# Patient Record
Sex: Female | Born: 1935 | Race: White | Hispanic: No | Smoking: Former smoker
Health system: Southern US, Community
[De-identification: ages and names within clinical notes are randomized; demographics above are authoritative.]

## PROBLEM LIST (undated history)

## (undated) DIAGNOSIS — E7800 Pure hypercholesterolemia, unspecified: Secondary | ICD-10-CM

## (undated) DIAGNOSIS — F329 Major depressive disorder, single episode, unspecified: Secondary | ICD-10-CM

## (undated) DIAGNOSIS — F32A Depression, unspecified: Secondary | ICD-10-CM

## (undated) DIAGNOSIS — N390 Urinary tract infection, site not specified: Secondary | ICD-10-CM

## (undated) HISTORY — DX: Major depressive disorder, single episode, unspecified: F32.9

## (undated) HISTORY — DX: Depression, unspecified: F32.A

## (undated) HISTORY — DX: Pure hypercholesterolemia, unspecified: E78.00

## (undated) HISTORY — PX: APPENDECTOMY: SHX54

---

## 2016-08-02 ENCOUNTER — Other Ambulatory Visit (HOSPITAL_COMMUNITY): Payer: Self-pay | Admitting: Nephrology

## 2016-08-02 DIAGNOSIS — N183 Chronic kidney disease, stage 3 unspecified: Secondary | ICD-10-CM

## 2016-09-08 ENCOUNTER — Ambulatory Visit (HOSPITAL_COMMUNITY)
Admission: RE | Admit: 2016-09-08 | Discharge: 2016-09-08 | Disposition: A | Discharge status: Home / Self Care | Payer: Medicare Other | Source: Ambulatory Visit | Attending: Nephrology | Admitting: Nephrology

## 2016-09-08 ENCOUNTER — Other Ambulatory Visit (HOSPITAL_COMMUNITY): Payer: Self-pay | Admitting: Diagnostic Radiology

## 2016-09-08 DIAGNOSIS — N183 Chronic kidney disease, stage 3 unspecified: Secondary | ICD-10-CM

## 2017-04-12 ENCOUNTER — Encounter (INDEPENDENT_AMBULATORY_CARE_PROVIDER_SITE_OTHER): Payer: Self-pay

## 2017-04-12 ENCOUNTER — Encounter (INDEPENDENT_AMBULATORY_CARE_PROVIDER_SITE_OTHER): Payer: Self-pay | Admitting: Internal Medicine

## 2017-05-05 ENCOUNTER — Encounter (INDEPENDENT_AMBULATORY_CARE_PROVIDER_SITE_OTHER): Payer: Self-pay | Admitting: Internal Medicine

## 2017-05-05 ENCOUNTER — Ambulatory Visit (INDEPENDENT_AMBULATORY_CARE_PROVIDER_SITE_OTHER): Payer: Medicare Other | Admitting: Internal Medicine

## 2017-05-05 VITALS — BP 138/60 | HR 64 | Temp 98.0°F | Ht 65.0 in | Wt 127.2 lb

## 2017-05-05 DIAGNOSIS — R634 Abnormal weight loss: Secondary | ICD-10-CM | POA: Diagnosis not present

## 2017-05-05 DIAGNOSIS — Z86010 Personal history of colonic polyps: Secondary | ICD-10-CM | POA: Diagnosis not present

## 2017-05-05 DIAGNOSIS — R110 Nausea: Secondary | ICD-10-CM | POA: Diagnosis not present

## 2017-05-05 LAB — HEPATIC FUNCTION PANEL
AG Ratio: 1.8 (calc) (ref 1.0–2.5)
ALT: 17 U/L (ref 6–29)
AST: 25 U/L (ref 10–35)
Albumin: 4.2 g/dL (ref 3.6–5.1)
Alkaline phosphatase (APISO): 61 U/L (ref 33–130)
Bilirubin, Direct: 0.1 mg/dL (ref 0.0–0.2)
Globulin: 2.3 g/dL (ref 1.9–3.7)
Indirect Bilirubin: 0.2 mg/dL (ref 0.2–1.2)
Total Bilirubin: 0.3 mg/dL (ref 0.2–1.2)
Total Protein: 6.5 g/dL (ref 6.1–8.1)

## 2017-05-05 LAB — CBC WITH DIFFERENTIAL/PLATELET
BASOS PCT: 0.3 %
Basophils Absolute: 19 cells/uL (ref 0–200)
EOS PCT: 2.1 %
Eosinophils Absolute: 130 cells/uL (ref 15–500)
HEMATOCRIT: 36.8 % (ref 35.0–45.0)
Hemoglobin: 12.6 g/dL (ref 11.7–15.5)
LYMPHS ABS: 1587 {cells}/uL (ref 850–3900)
MCH: 29.7 pg (ref 27.0–33.0)
MCHC: 34.2 g/dL (ref 32.0–36.0)
MCV: 86.8 fL (ref 80.0–100.0)
MPV: 11 fL (ref 7.5–12.5)
Monocytes Relative: 8.3 %
NEUTROS PCT: 63.7 %
Neutro Abs: 3949 cells/uL (ref 1500–7800)
PLATELETS: 230 10*3/uL (ref 140–400)
RBC: 4.24 10*6/uL (ref 3.80–5.10)
RDW: 12.4 % (ref 11.0–15.0)
Total Lymphocyte: 25.6 %
WBC: 6.2 10*3/uL (ref 3.8–10.8)
WBCMIX: 515 {cells}/uL (ref 200–950)

## 2017-05-05 MED ORDER — ONDANSETRON HCL 4 MG PO TABS: 4.0000 mg | ORAL_TABLET | Freq: Three times a day (TID) | ORAL | 1 refills | Status: DC | PRN

## 2017-05-05 NOTE — Progress Notes (Addendum)
   Subjective:    Patient ID: Kristen Best, female    DOB: December 18, 1932, 83 y.o.   MRN: [id number]  HPI Referred by Dr. Bryna ColanderKikel for unexpected weight loss. Hx of colonic polyps.  Hx of adenomatous polyp of colon. She denies any heart burn. She says she has nausea all at he time. Sometimes she can even look at food and becomes nauseated. She says she has no interest in anything and doesn't want to do anything.  She has to make her self do what she do. Her last colonoscopy was in 2014 by Dr. Aleene DavidsonSpainhour and was normal. Per records, no further screening colonoscopies.. I will try to find those records.  Last year, she weighed about 148. Today her weight is 127.2.  She says she is under a lot of stress.  She has a BM x 1 a day. No melena or BRRB.  Review of Systems No past medical history on file.  No past surgical history on file.  No Known Allergies  Current Outpatient Medications on File Prior to Visit  Medication Sig Dispense Refill  . citalopram (CELEXA) 20 MG tablet Take 20 mg daily by mouth.    . clonazePAM (KLONOPIN) 1 MG tablet Take 1 mg at bedtime by mouth.    . co-enzyme Q-10 30 MG capsule Take 30 mg 3 (three) times daily by mouth.    . fenofibrate 160 MG tablet Take 160 mg daily by mouth.    Marland Kitchen. lisinopril (PRINIVIL,ZESTRIL) 10 MG tablet Take 10 mg daily by mouth.     No current facility-administered medications on file prior to visit.         Objective:   Physical Exam Blood pressure 138/60, pulse 64, temperature 98 F (36.7 C), height 5\' 5"  (1.651 m), weight 127 lb 3.2 oz (57.7 kg). Alert and oriented. Skin warm and dry. Oral mucosa is moist.   . Sclera anicteric, conjunctivae is pink. Thyroid not enlarged. No cervical lymphadenopathy. Lungs clear. Heart regular rate and rhythm.  Abdomen is soft. Bowel sounds are positive. No hepatomegaly. No abdominal masses felt. No tenderness.  No edema to lower extremities.        Assessment & Plan:    Depression: Needs to follow  up with her PCP. Nausea related to her depression. Even the site of foods causes nausea. Needs to follow up with PCP concerning this [facility]

## 2017-05-05 NOTE — Patient Instructions (Signed)
Will get records from Dr. Aleene DavidsonSpainhour.

## 2017-05-26 ENCOUNTER — Encounter (INDEPENDENT_AMBULATORY_CARE_PROVIDER_SITE_OTHER): Payer: Self-pay | Admitting: Internal Medicine

## 2017-05-26 NOTE — Progress Notes (Signed)
Patient was given an appointment for 07/26/17 at 10:45am.  A letter was mailed to the patient.

## 2017-06-01 IMAGING — US US RENAL
1 series · 14 of 25 positions shown · non-contrast
Comparison: None.

CLINICAL DATA: Chronic kidney disease stage 3

EXAM:
RENAL / URINARY TRACT ULTRASOUND COMPLETE

[Series 1: us renal · 0.23mm/px · 14 of 54 slices shown]
[im 1/54]
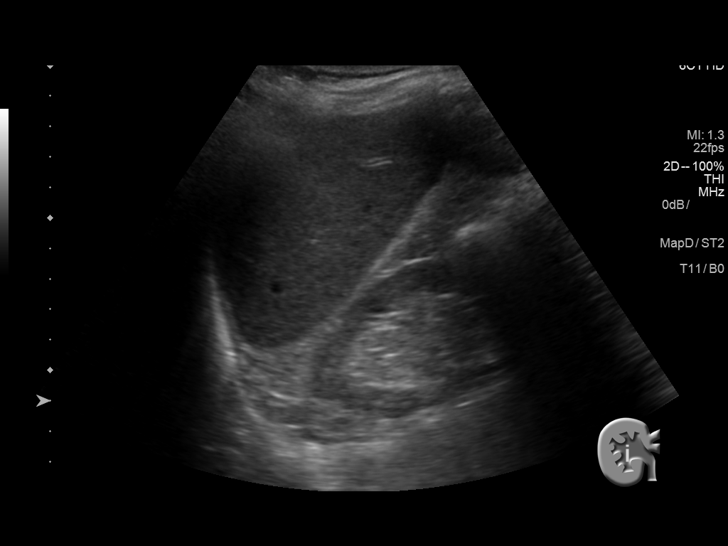
[im 5/54]
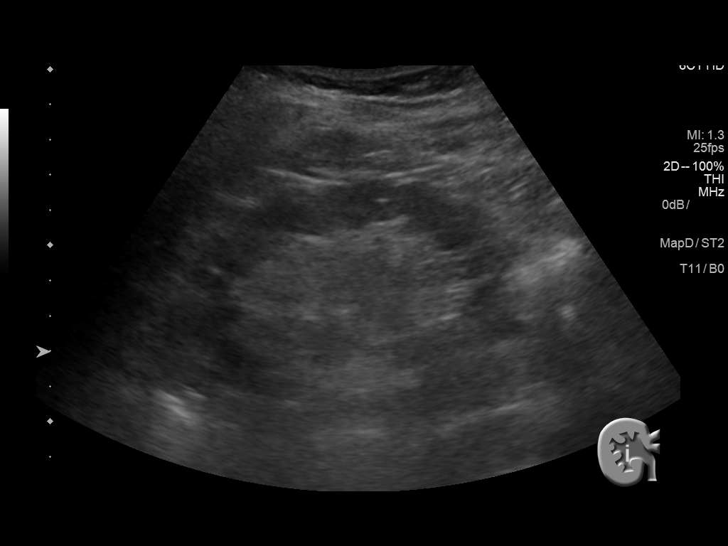
[im 9/54]
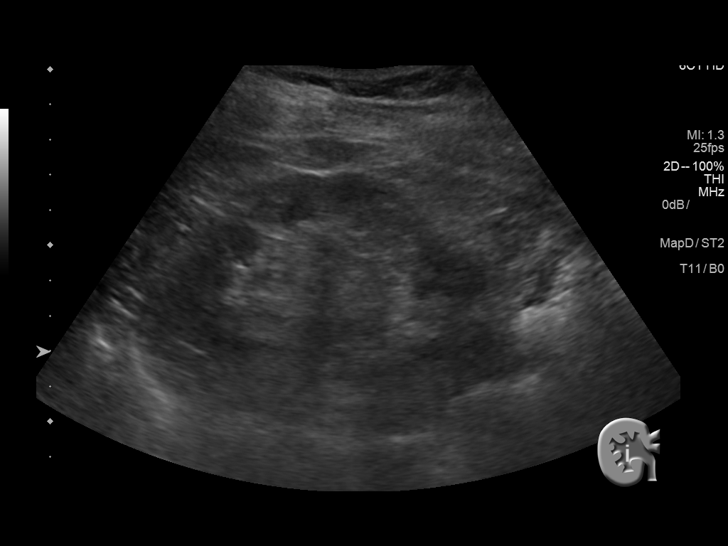
[im 14/54]
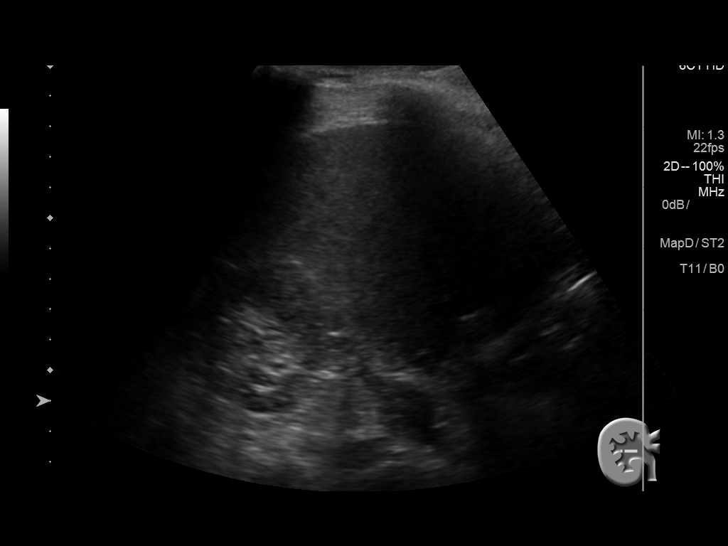
[im 18/54]
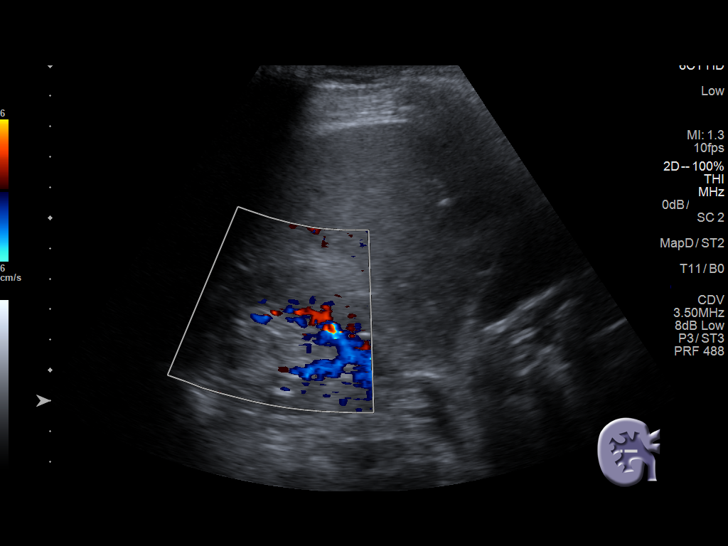
[im 20/54]
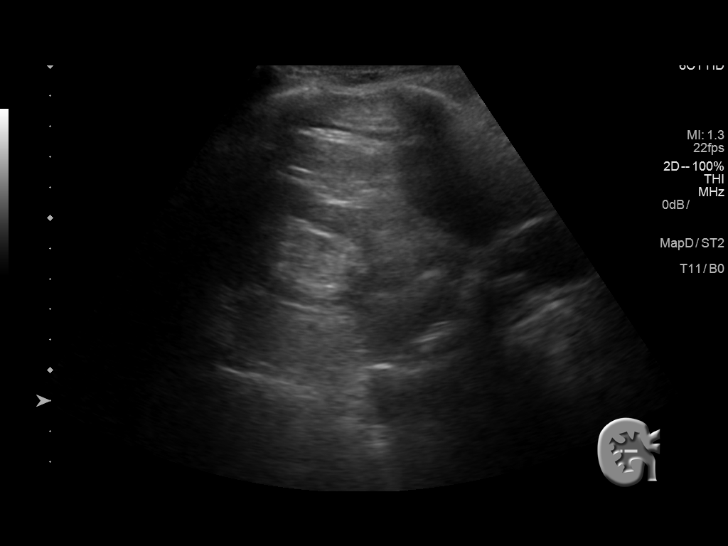
[im 25/54]
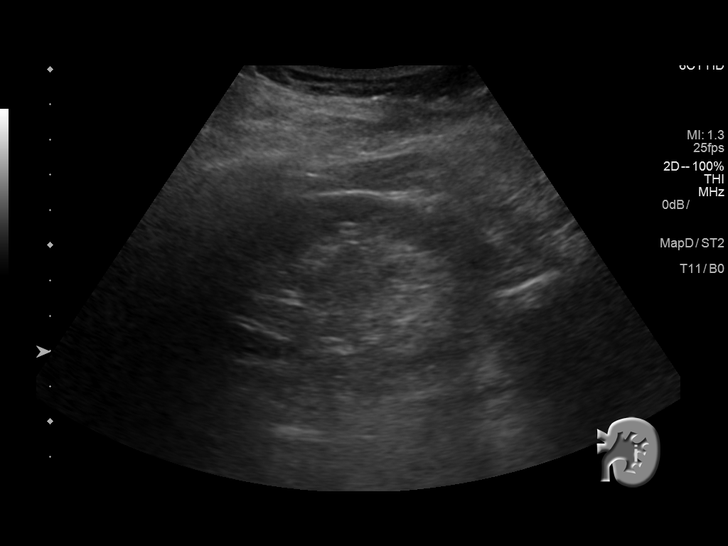
[im 29/54]
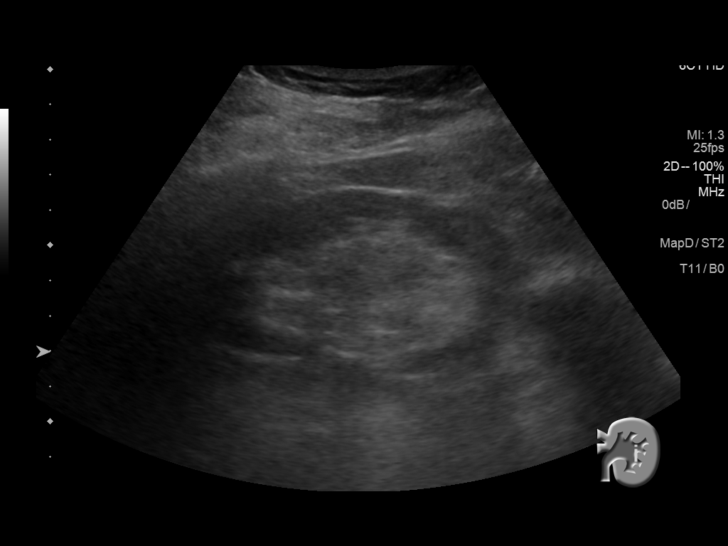
[im 34/54]
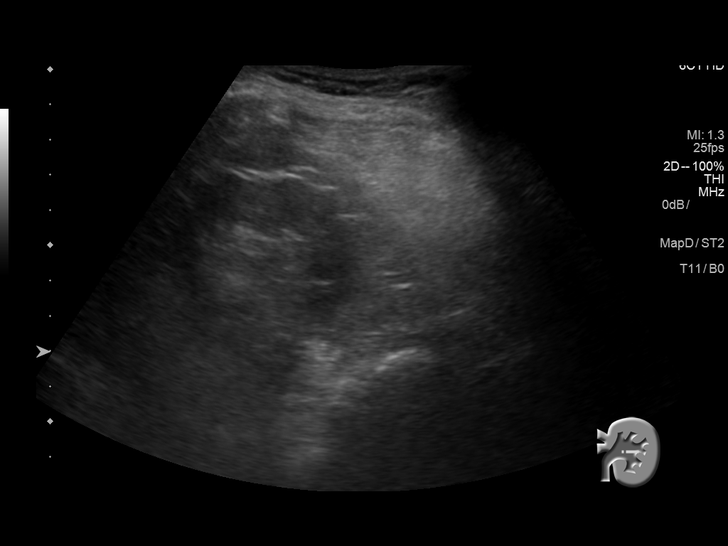
[im 36/54]
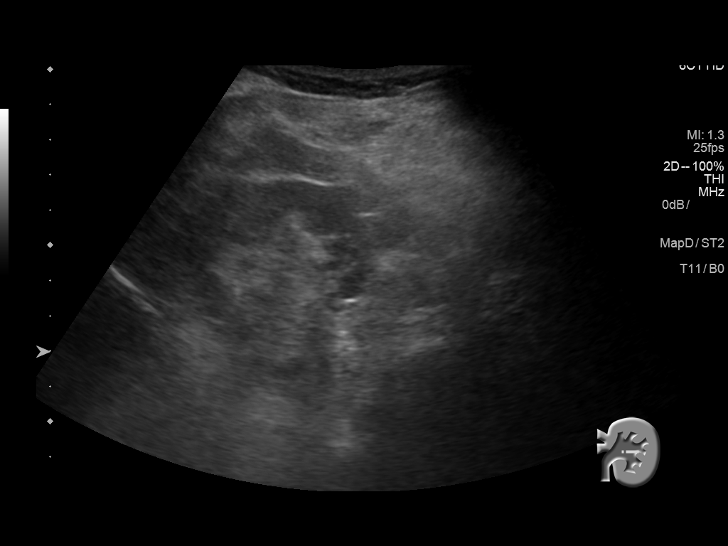
[im 40/54]
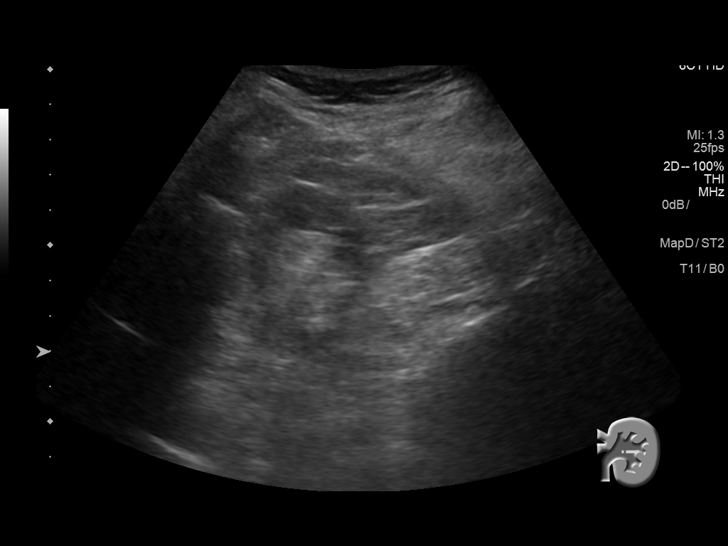
[im 45/54]
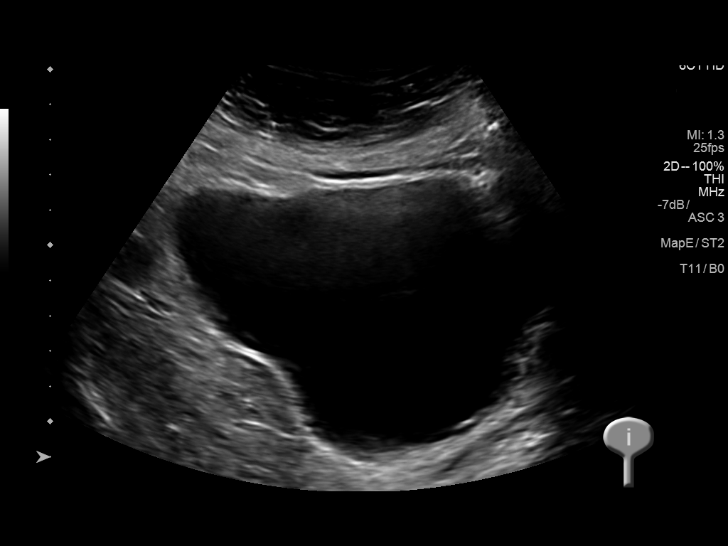
[im 49/54]
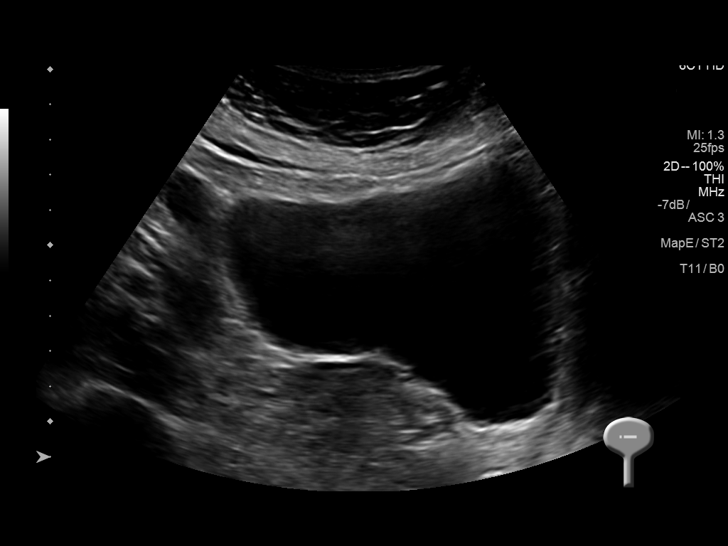
[im 54/54]
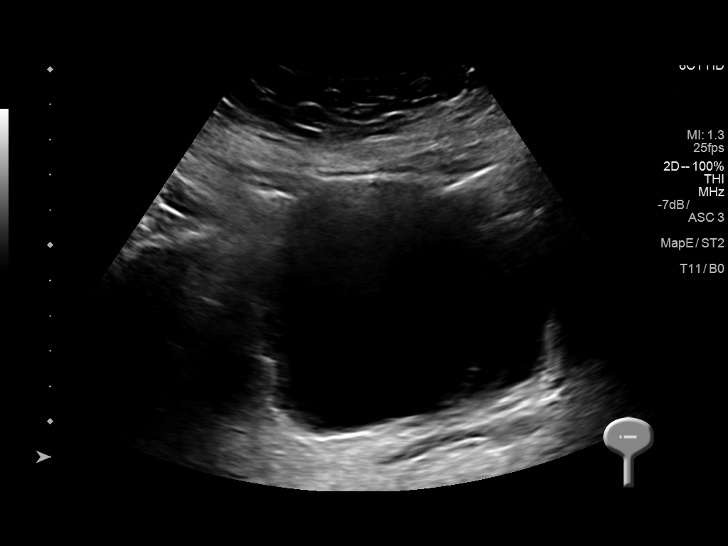

[14 of 25 positions shown; findings below may reference images not displayed]

FINDINGS: Right Kidney:

Length: 10.1 cm. Mild cortical thinning. Normal echotexture. No
hydronephrosis or mass.

Left Kidney:

Length: 10.6 cm. Mild cortical thinning. Normal echotexture. No
hydronephrosis or mass.

Bladder:

Appears normal for degree of bladder distention.
IMPRESSION: Cortical thinning bilaterally.  No acute findings.

## 2017-07-26 ENCOUNTER — Ambulatory Visit (INDEPENDENT_AMBULATORY_CARE_PROVIDER_SITE_OTHER): Payer: Medicare Other | Admitting: Internal Medicine

## 2021-12-26 ENCOUNTER — Emergency Department (HOSPITAL_COMMUNITY)
Admission: EM | Admit: 2021-12-26 | Discharge: 2021-12-26 | Disposition: A | Discharge status: Home / Self Care | Payer: Medicare Other | Attending: Emergency Medicine | Admitting: Emergency Medicine

## 2021-12-26 ENCOUNTER — Other Ambulatory Visit: Payer: Self-pay

## 2021-12-26 ENCOUNTER — Encounter (HOSPITAL_COMMUNITY): Payer: Self-pay

## 2021-12-26 DIAGNOSIS — F410 Panic disorder [episodic paroxysmal anxiety] without agoraphobia: Secondary | ICD-10-CM | POA: Diagnosis not present

## 2021-12-26 DIAGNOSIS — Z79899 Other long term (current) drug therapy: Secondary | ICD-10-CM | POA: Insufficient documentation

## 2021-12-26 DIAGNOSIS — R531 Weakness: Secondary | ICD-10-CM | POA: Insufficient documentation

## 2021-12-26 DIAGNOSIS — F419 Anxiety disorder, unspecified: Secondary | ICD-10-CM | POA: Insufficient documentation

## 2021-12-26 HISTORY — DX: Urinary tract infection, site not specified: N39.0

## 2021-12-26 LAB — COMPREHENSIVE METABOLIC PANEL
ALT: 19 U/L (ref 0–44)
AST: 32 U/L (ref 15–41)
Albumin: 3.8 g/dL (ref 3.5–5.0)
Alkaline Phosphatase: 116 U/L (ref 38–126)
Anion gap: 9 (ref 5–15)
BUN: 16 mg/dL (ref 8–23)
CO2: 21 mmol/L — ABNORMAL LOW (ref 22–32)
Calcium: 9.5 mg/dL (ref 8.9–10.3)
Chloride: 110 mmol/L (ref 98–111)
Creatinine, Ser: 1.22 mg/dL — ABNORMAL HIGH (ref 0.44–1.00)
GFR, Estimated: 43 mL/min — ABNORMAL LOW (ref >60)
Glucose, Bld: 92 mg/dL (ref 70–99)
Potassium: 3.6 mmol/L (ref 3.5–5.1)
Sodium: 140 mmol/L (ref 135–145)
Total Bilirubin: 0.9 mg/dL (ref 0.3–1.2)
Total Protein: 6.8 g/dL (ref 6.5–8.1)

## 2021-12-26 LAB — CBC WITH DIFFERENTIAL/PLATELET
Abs Immature Granulocytes: 0.01 10*3/uL (ref 0.00–0.07)
Basophils Absolute: 0 10*3/uL (ref 0.0–0.1)
Basophils Relative: 0 %
Eosinophils Absolute: 0.1 10*3/uL (ref 0.0–0.5)
Eosinophils Relative: 1 %
HCT: 38.9 % (ref 36.0–46.0)
Hemoglobin: 12.8 g/dL (ref 12.0–15.0)
Immature Granulocytes: 0 %
Lymphocytes Relative: 14 %
Lymphs Abs: 1 10*3/uL (ref 0.7–4.0)
MCH: 29.6 pg (ref 26.0–34.0)
MCHC: 32.9 g/dL (ref 30.0–36.0)
MCV: 90 fL (ref 80.0–100.0)
Monocytes Absolute: 0.7 10*3/uL (ref 0.1–1.0)
Monocytes Relative: 9 %
Neutro Abs: 5.7 10*3/uL (ref 1.7–7.7)
Neutrophils Relative %: 76 %
Platelets: 182 10*3/uL (ref 150–400)
RBC: 4.32 MIL/uL (ref 3.87–5.11)
RDW: 13.2 % (ref 11.5–15.5)
WBC: 7.5 10*3/uL (ref 4.0–10.5)
nRBC: 0 % (ref 0.0–0.2)

## 2021-12-26 LAB — URINALYSIS, ROUTINE W REFLEX MICROSCOPIC
Bacteria, UA: NONE SEEN
Bilirubin Urine: NEGATIVE
Glucose, UA: NEGATIVE mg/dL
Ketones, ur: 5 mg/dL — AB
Leukocytes,Ua: NEGATIVE
Nitrite: NEGATIVE
Protein, ur: NEGATIVE mg/dL
Specific Gravity, Urine: 1.003 — ABNORMAL LOW (ref 1.005–1.030)
pH: 6 (ref 5.0–8.0)

## 2021-12-26 MED ORDER — SODIUM CHLORIDE 0.9 % IV BOLUS
1000.0000 mL | Freq: Once | INTRAVENOUS | Status: AC
  Administered 2021-12-26: 1000 mL via INTRAVENOUS

## 2021-12-26 NOTE — ED Provider Notes (Signed)
[facility] EMERGENCY DEPARTMENT Provider Note   CSN: [id number] Arrival date & time: 12/26/21  1037     History  Chief Complaint  Patient presents with   Panic Attack    Kristen Best is a 88 y.o. female.  HPI Patient presents from EMS with generalized concerns of not feeling well.  She cannot specify when she began feeling well to state that she felt poorly and did not take her medication today, possibly also yesterday.  She has a chart review history notable for anxiety, but denies this, states that she takes no medication for it.  She does note that she has had falls recently, including one earlier in the week which resulted in obvious hematoma to her left face.  She denies any facial pain, however. EMS reports no hemodynamic instability in route.  Patient was awake, alert throughout transport.    Home Medications Prior to Admission medications   Medication Sig Start Date End Date Taking? Authorizing Provider  citalopram (CELEXA) 20 MG tablet Take 20 mg daily by mouth.    [provider]  clonazePAM (KLONOPIN) 1 MG tablet Take 1 mg at bedtime by mouth.    [provider]  co-enzyme Q-10 30 MG capsule Take 30 mg 3 (three) times daily by mouth.    [provider]  fenofibrate 160 MG tablet Take 160 mg daily by mouth.    [provider]  lisinopril (PRINIVIL,ZESTRIL) 10 MG tablet Take 10 mg daily by mouth.    [provider]  ondansetron (ZOFRAN) 4 MG tablet Take 1 tablet (4 mg total) every 8 (eight) hours as needed by mouth for nausea or vomiting. 05/05/17   Setzer, Brand Males, NP      Allergies    Patient has no known allergies.    Review of Systems   Review of Systems  All other systems reviewed and are negative.   Physical Exam Updated Vital Signs BP (!) 149/83   Pulse 81   Temp (!) 97.4 F (36.3 C) (Oral)   Resp 18   Ht 5\' 7"  (1.702 m)   Wt 63 kg   SpO2 100%   BMI 21.77 kg/m  Physical Exam Vitals and nursing note  reviewed.  Constitutional:      General: She is not in acute distress.    Appearance: She is well-developed.  HENT:     Head: Normocephalic.   Eyes:     Conjunctiva/sclera: Conjunctivae normal.  Cardiovascular:     Rate and Rhythm: Normal rate and regular rhythm.  Pulmonary:     Effort: Pulmonary effort is normal. No respiratory distress.     Breath sounds: Normal breath sounds. No stridor.  Abdominal:     General: There is no distension.  Skin:    General: Skin is warm and dry.  Neurological:     Mental Status: She is alert and oriented to person, place, and time.     Cranial Nerves: No cranial nerve deficit or dysarthria.     Motor: Atrophy present. No tremor or abnormal muscle tone.  Psychiatric:        Mood and Affect: Mood normal.     Comments: Not insightful into her history, nor today's presentation.     ED Results / Procedures / Treatments   Labs (all labs ordered are listed, but only abnormal results are displayed) Labs Reviewed  COMPREHENSIVE METABOLIC PANEL - Abnormal; Notable for the following components:      Result Value   CO2 21 (*)  Creatinine, Ser 1.22 (*)    GFR, Estimated 43 (*)    All other components within normal limits  URINALYSIS, ROUTINE W REFLEX MICROSCOPIC - Abnormal; Notable for the following components:   Color, Urine COLORLESS (*)    Specific Gravity, Urine 1.003 (*)    Hgb urine dipstick SMALL (*)    Ketones, ur 5 (*)    All other components within normal limits  CBC WITH DIFFERENTIAL/PLATELET    EKG EKG Interpretation  Date/Time:  Saturday December 26 2021 10:44:27 EDT Ventricular Rate:  88 PR Interval:  163 QRS Duration: 90 QT Interval:  390 QTC Calculation: 472 R Axis:   70 Text Interpretation: Sinus rhythm Low voltage, precordial leads Artifact Abnormal ECG Confirmed by Gerhard Munch [phone]) on 12/26/2021 10:51:13 AM  Radiology No results found.  Procedures Procedures    Medications Ordered in ED Medications  sodium  chloride 0.9 % bolus 1,000 mL (0 mLs Intravenous Stopped 12/26/21 1406)    ED Course/ Medical Decision Making/ A&P This patient with a Hx of multiple medical issues including anxiety, hypertension presents to the ED for concern of generalized weakness, this involves an extensive number of treatment options, and is a complaint that carries with it a high risk of complications and morbidity.    The differential diagnosis includes electrolyte abnormalities, medication noncompliance, dehydration, bacteremia, sepsis   Social Determinants of Health:  PMH, anxiety  Additional history obtained:  Additional history and/or information obtained from EMS, notable for details of HPI   After the initial evaluation, orders, including: Labs ECG were initiated.   Patient placed on Cardiac and Pulse-Oximetry Monitors. The patient was maintained on a cardiac monitor.  The cardiac monitored showed an rhythm of 80 sinus normal The patient was also maintained on pulse oximetry. The readings were typically 100% room air normal   On repeat evaluation of the patient improved 2:46 PM Patient states I feel pretty good.  She is accompanied by her granddaughter and son for reviewing all Lab Tests:  I personally interpreted labs.  The pertinent results include: No evidence for urinary tract infection, mild ketone urea, creatinine 1.22 both consistent with mild dehydration   Dispostion / Final MDM:  After consideration of the diagnostic results and the patient's response to treatment, this adult female presenting from home with varying complaints, is awake, alert, sitting upright, monitored for hours without decompensation, arrhythmia, hypotension, fever, with reassuring labs low suspicion for uremia, bacteremia or sepsis.  Patient moves all extremity spontaneously, has no facial asymmetry, and that she has fallen in the past week no suspicion for intracranial hemorrhage or fracture.  With son present we  discussed her history, today's fall, though admission was a consideration given her advanced age, unclear circumstances of the presentation, with reassuring findings, he and they are okay with outpatient follow-up, and he will facilitate gerontology follow-up, assessment.  Final Clinical Impression(s) / ED Diagnoses Final diagnoses:  Weakness     Gerhard Munch, MD 12/26/21 1446

## 2021-12-26 NOTE — ED Triage Notes (Signed)
Pt lives at home alone  and c/o heavy breathing and numbness in her hands and arms. Pt has a hx of anxiety but is not being treated. Pt fell on weds without being seen, bruise on left side of face. Pt is on Amoxicillin for UTI , taken 5 doses.

## 2021-12-26 NOTE — Discharge Instructions (Addendum)
As discussed, today's evaluation has been generally reassuring.  However, with today's evaluation for weakness, some increased frequency of falling, and your recent urinary tract infection and is very importantly follow-up with an outpatient clinician for appropriate ongoing outpatient evaluation including consideration of your medication regimen.  Return here for concerning changes in your condition.

## 2024-01-07 ENCOUNTER — Inpatient Hospital Stay (HOSPITAL_COMMUNITY)
Admission: EM | Admit: 2024-01-07 | Discharge: 2024-01-12 | DRG: 563 | Disposition: A | Discharge status: Skilled Nursing Facility | Attending: Family Medicine | Admitting: Family Medicine

## 2024-01-07 ENCOUNTER — Encounter (HOSPITAL_COMMUNITY): Payer: Self-pay

## 2024-01-07 ENCOUNTER — Inpatient Hospital Stay (HOSPITAL_COMMUNITY)

## 2024-01-07 ENCOUNTER — Other Ambulatory Visit: Payer: Self-pay

## 2024-01-07 ENCOUNTER — Emergency Department (HOSPITAL_COMMUNITY)

## 2024-01-07 DIAGNOSIS — F32A Depression, unspecified: Secondary | ICD-10-CM | POA: Diagnosis present

## 2024-01-07 DIAGNOSIS — Z79899 Other long term (current) drug therapy: Secondary | ICD-10-CM

## 2024-01-07 DIAGNOSIS — S52611A Displaced fracture of right ulna styloid process, initial encounter for closed fracture: Secondary | ICD-10-CM | POA: Diagnosis present

## 2024-01-07 DIAGNOSIS — F0394 Unspecified dementia, unspecified severity, with anxiety: Secondary | ICD-10-CM | POA: Diagnosis present

## 2024-01-07 DIAGNOSIS — I129 Hypertensive chronic kidney disease with stage 1 through stage 4 chronic kidney disease, or unspecified chronic kidney disease: Secondary | ICD-10-CM | POA: Diagnosis present

## 2024-01-07 DIAGNOSIS — M25532 Pain in left wrist: Secondary | ICD-10-CM | POA: Diagnosis present

## 2024-01-07 DIAGNOSIS — F0393 Unspecified dementia, unspecified severity, with mood disturbance: Secondary | ICD-10-CM | POA: Diagnosis present

## 2024-01-07 DIAGNOSIS — W010XXA Fall on same level from slipping, tripping and stumbling without subsequent striking against object, initial encounter: Secondary | ICD-10-CM | POA: Diagnosis present

## 2024-01-07 DIAGNOSIS — F0390 Unspecified dementia without behavioral disturbance: Secondary | ICD-10-CM | POA: Diagnosis present

## 2024-01-07 DIAGNOSIS — R413 Other amnesia: Secondary | ICD-10-CM | POA: Diagnosis present

## 2024-01-07 DIAGNOSIS — W19XXXA Unspecified fall, initial encounter: Secondary | ICD-10-CM | POA: Diagnosis present

## 2024-01-07 DIAGNOSIS — S62102A Fracture of unspecified carpal bone, left wrist, initial encounter for closed fracture: Secondary | ICD-10-CM | POA: Diagnosis not present

## 2024-01-07 DIAGNOSIS — I10 Essential (primary) hypertension: Secondary | ICD-10-CM | POA: Diagnosis not present

## 2024-01-07 DIAGNOSIS — N1832 Chronic kidney disease, stage 3b: Secondary | ICD-10-CM | POA: Diagnosis present

## 2024-01-07 DIAGNOSIS — E559 Vitamin D deficiency, unspecified: Secondary | ICD-10-CM | POA: Diagnosis present

## 2024-01-07 DIAGNOSIS — F03C0 Unspecified dementia, severe, without behavioral disturbance, psychotic disturbance, mood disturbance, and anxiety: Secondary | ICD-10-CM

## 2024-01-07 DIAGNOSIS — S52502A Unspecified fracture of the lower end of left radius, initial encounter for closed fracture: Secondary | ICD-10-CM | POA: Diagnosis present

## 2024-01-07 DIAGNOSIS — E1122 Type 2 diabetes mellitus with diabetic chronic kidney disease: Secondary | ICD-10-CM | POA: Diagnosis present

## 2024-01-07 DIAGNOSIS — Y92009 Unspecified place in unspecified non-institutional (private) residence as the place of occurrence of the external cause: Secondary | ICD-10-CM | POA: Diagnosis not present

## 2024-01-07 DIAGNOSIS — Z87891 Personal history of nicotine dependence: Secondary | ICD-10-CM | POA: Diagnosis not present

## 2024-01-07 DIAGNOSIS — E782 Mixed hyperlipidemia: Secondary | ICD-10-CM | POA: Diagnosis present

## 2024-01-07 DIAGNOSIS — E785 Hyperlipidemia, unspecified: Secondary | ICD-10-CM | POA: Insufficient documentation

## 2024-01-07 LAB — BASIC METABOLIC PANEL WITH GFR
Anion gap: 12 (ref 5–15)
BUN: 24 mg/dL — ABNORMAL HIGH (ref 8–23)
CO2: 21 mmol/L — ABNORMAL LOW (ref 22–32)
Calcium: 9.6 mg/dL (ref 8.9–10.3)
Chloride: 103 mmol/L (ref 98–111)
Creatinine, Ser: 1.33 mg/dL — ABNORMAL HIGH (ref 0.44–1.00)
GFR, Estimated: 38 mL/min — ABNORMAL LOW (ref >60)
Glucose, Bld: 223 mg/dL — ABNORMAL HIGH (ref 70–99)
Potassium: 4.2 mmol/L (ref 3.5–5.1)
Sodium: 136 mmol/L (ref 135–145)

## 2024-01-07 LAB — CBC WITH DIFFERENTIAL/PLATELET
Abs Immature Granulocytes: 0.04 K/uL (ref 0.00–0.07)
Basophils Absolute: 0 K/uL (ref 0.0–0.1)
Basophils Relative: 0 %
Eosinophils Absolute: 0 K/uL (ref 0.0–0.5)
Eosinophils Relative: 0 %
HCT: 37.5 % (ref 36.0–46.0)
Hemoglobin: 12.7 g/dL (ref 12.0–15.0)
Immature Granulocytes: 0 %
Lymphocytes Relative: 9 %
Lymphs Abs: 0.8 K/uL (ref 0.7–4.0)
MCH: 30.7 pg (ref 26.0–34.0)
MCHC: 33.9 g/dL (ref 30.0–36.0)
MCV: 90.6 fL (ref 80.0–100.0)
Monocytes Absolute: 0.7 K/uL (ref 0.1–1.0)
Monocytes Relative: 7 %
Neutro Abs: 7.5 K/uL (ref 1.7–7.7)
Neutrophils Relative %: 84 %
Platelets: 203 K/uL (ref 150–400)
RBC: 4.14 MIL/uL (ref 3.87–5.11)
RDW: 13.6 % (ref 11.5–15.5)
WBC: 9 K/uL (ref 4.0–10.5)
nRBC: 0 % (ref 0.0–0.2)

## 2024-01-07 LAB — CK: Total CK: 238 U/L — ABNORMAL HIGH (ref 38–234)

## 2024-01-07 LAB — GLUCOSE, CAPILLARY: Glucose-Capillary: 225 mg/dL — ABNORMAL HIGH (ref 70–99)

## 2024-01-07 MED ORDER — ENOXAPARIN SODIUM 30 MG/0.3ML IJ SOSY
30.0000 mg | PREFILLED_SYRINGE | INTRAMUSCULAR | Status: DC
  Administered 2024-01-07 – 2024-01-11 (×5): 30 mg via SUBCUTANEOUS
  Filled 2024-01-07 (×5): qty 0.3

## 2024-01-07 MED ORDER — CLONAZEPAM 0.5 MG PO TABS
1.0000 mg | ORAL_TABLET | Freq: Every day | ORAL | Status: DC
  Administered 2024-01-07 – 2024-01-11 (×5): 1 mg via ORAL
  Filled 2024-01-07 (×5): qty 2

## 2024-01-07 MED ORDER — ONDANSETRON HCL 4 MG/2ML IJ SOLN: 4.0000 mg | Freq: Four times a day (QID) | INTRAMUSCULAR | Status: DC | PRN

## 2024-01-07 MED ORDER — DIPHENHYDRAMINE HCL 50 MG/ML IJ SOLN
25.0000 mg | Freq: Once | INTRAMUSCULAR | Status: AC
  Administered 2024-01-07: 25 mg via INTRAVENOUS
  Filled 2024-01-07: qty 1

## 2024-01-07 MED ORDER — OXYCODONE HCL 5 MG PO TABS
5.0000 mg | ORAL_TABLET | Freq: Once | ORAL | Status: AC
  Administered 2024-01-07: 5 mg via ORAL
  Filled 2024-01-07: qty 1

## 2024-01-07 MED ORDER — DROPERIDOL 2.5 MG/ML IJ SOLN
1.2500 mg | Freq: Once | INTRAMUSCULAR | Status: AC
  Administered 2024-01-07: 1.25 mg via INTRAVENOUS
  Filled 2024-01-07: qty 2

## 2024-01-07 MED ORDER — OXYCODONE HCL 5 MG PO TABS: 5.0000 mg | ORAL_TABLET | Freq: Three times a day (TID) | ORAL | 0 refills | Status: DC | PRN

## 2024-01-07 MED ORDER — BISACODYL 5 MG PO TBEC: 5.0000 mg | DELAYED_RELEASE_TABLET | Freq: Every day | ORAL | Status: DC | PRN

## 2024-01-07 MED ORDER — ROSUVASTATIN CALCIUM 20 MG PO TABS
20.0000 mg | ORAL_TABLET | Freq: Every day | ORAL | Status: DC
  Administered 2024-01-08 – 2024-01-12 (×5): 20 mg via ORAL
  Filled 2024-01-07 (×5): qty 1

## 2024-01-07 MED ORDER — LIDOCAINE-EPINEPHRINE (PF) 2 %-1:200000 IJ SOLN
10.0000 mL | Freq: Once | INTRAMUSCULAR | Status: AC
  Administered 2024-01-07: 10 mL via INTRADERMAL
  Filled 2024-01-07: qty 20

## 2024-01-07 MED ORDER — ONDANSETRON HCL 4 MG PO TABS: 4.0000 mg | ORAL_TABLET | Freq: Four times a day (QID) | ORAL | Status: DC | PRN

## 2024-01-07 MED ORDER — OXYCODONE HCL 5 MG PO TABS
5.0000 mg | ORAL_TABLET | ORAL | Status: DC | PRN
  Administered 2024-01-07 – 2024-01-10 (×2): 5 mg via ORAL
  Filled 2024-01-07 (×5): qty 1

## 2024-01-07 MED ORDER — LISINOPRIL 10 MG PO TABS
10.0000 mg | ORAL_TABLET | Freq: Every day | ORAL | Status: DC
  Administered 2024-01-08 – 2024-01-10 (×3): 10 mg via ORAL
  Filled 2024-01-07 (×3): qty 1

## 2024-01-07 MED ORDER — DONEPEZIL HCL 5 MG PO TABS
5.0000 mg | ORAL_TABLET | Freq: Every day | ORAL | Status: DC
  Administered 2024-01-07 – 2024-01-12 (×6): 5 mg via ORAL
  Filled 2024-01-07 (×6): qty 1

## 2024-01-07 MED ORDER — MEMANTINE HCL 10 MG PO TABS
10.0000 mg | ORAL_TABLET | Freq: Two times a day (BID) | ORAL | Status: DC
  Administered 2024-01-07 – 2024-01-12 (×10): 10 mg via ORAL
  Filled 2024-01-07 (×10): qty 1

## 2024-01-07 MED ORDER — FENOFIBRATE 160 MG PO TABS: 160.0000 mg | ORAL_TABLET | Freq: Every day | ORAL | Status: DC

## 2024-01-07 MED ORDER — INSULIN ASPART 100 UNIT/ML IJ SOLN
0.0000 [IU] | Freq: Every day | INTRAMUSCULAR | Status: DC
  Administered 2024-01-07: 2 [IU] via SUBCUTANEOUS

## 2024-01-07 MED ORDER — INSULIN ASPART 100 UNIT/ML IJ SOLN
0.0000 [IU] | Freq: Three times a day (TID) | INTRAMUSCULAR | Status: DC
  Administered 2024-01-08 (×3): 2 [IU] via SUBCUTANEOUS
  Administered 2024-01-09: 3 [IU] via SUBCUTANEOUS

## 2024-01-07 MED ORDER — BUPIVACAINE-EPINEPHRINE (PF) 0.5% -1:200000 IJ SOLN: 10.0000 mL | Freq: Once | INTRAMUSCULAR | Status: DC

## 2024-01-07 MED ORDER — AMITRIPTYLINE HCL 25 MG PO TABS
50.0000 mg | ORAL_TABLET | Freq: Every day | ORAL | Status: DC
  Administered 2024-01-07 – 2024-01-11 (×5): 50 mg via ORAL
  Filled 2024-01-07 (×5): qty 2

## 2024-01-07 MED ORDER — SODIUM CHLORIDE 0.9 % IV BOLUS
500.0000 mL | Freq: Once | INTRAVENOUS | Status: AC
  Administered 2024-01-07: 500 mL via INTRAVENOUS

## 2024-01-07 NOTE — ED Notes (Signed)
Pt ambulated to the bathroom with stand by assistance.

## 2024-01-07 NOTE — ED Notes (Signed)
 Pt placed in finger traps per provider.

## 2024-01-07 NOTE — H&P (Signed)
 History and Physical    Patient: Kristen Best FMW:[id number] DOB: [birth year] DOA: 01/07/2024 DOS: the patient was seen and examined on 01/07/2024 PCP: Patient, No Pcp Per  Patient coming from: Home  Chief Complaint:  Chief Complaint  Patient presents with   Fall   HPI: Kristen Best is a 89 y.o. female with medical history significant of dementia, hypertension, hyperlipidemia, diabetes.  Patient lives with caregiver who was recently hospitalized and will have a prolonged hospitalization.  Patient was home last night and got out of bed.  She ended up falling onto her outstretched hand.  No head injury or loss of consciousness.  She continues to have pain in her left wrist and was brought to the emergency department for evaluation.  No numbness or tingling in the left hand.  She has nobody else to care for her at home.  Review of Systems: As mentioned in the history of present illness. All other systems reviewed and are negative. Past Medical History:  Diagnosis Date   Depression    High cholesterol    UTI (urinary tract infection)    Past Surgical History:  Procedure Laterality Date   APPENDECTOMY     Social History:  reports that she has quit smoking. She has never used smokeless tobacco. She reports that she does not drink alcohol and does not use drugs.  No Known Allergies  History reviewed. No pertinent family history.  Prior to Admission medications   Medication Sig Start Date End Date Taking? Authorizing Provider  oxyCODONE  (ROXICODONE ) 5 MG immediate release tablet Take 1 tablet (5 mg total) by mouth every 8 (eight) hours as needed for severe pain (pain score 7-10). 01/07/24  Yes Elnor Savant A, DO  amitriptyline  (ELAVIL ) 50 MG tablet Take 50 mg by mouth at bedtime.    [provider]  citalopram (CELEXA) 20 MG tablet Take 20 mg daily by mouth.    [provider]  clonazePAM  (KLONOPIN ) 1 MG tablet Take 1 mg at bedtime by mouth.    [provider]  co-enzyme Q-10 30 MG capsule Take 30 mg 3 (three) times daily by mouth.    [provider]  donepezil  (ARICEPT ) 10 MG tablet Take 5 mg by mouth daily.    [provider]  fenofibrate  160 MG tablet Take 160 mg daily by mouth.    [provider]  lisinopril  (PRINIVIL ,ZESTRIL ) 10 MG tablet Take 10 mg daily by mouth.    [provider]  memantine  (NAMENDA ) 10 MG tablet Take 10 mg by mouth 2 (two) times daily.    [provider]  ondansetron  (ZOFRAN ) 4 MG tablet Take 1 tablet (4 mg total) every 8 (eight) hours as needed by mouth for nausea or vomiting. 05/05/17   Setzer, Veva CROME, NP  rosuvastatin  (CRESTOR ) 20 MG tablet Take 20 mg by mouth daily.    [provider]  venlafaxine XR (EFFEXOR-XR) 150 MG 24 hr capsule Take 150 mg by mouth every morning.    [provider]    Physical Exam: Vitals:   01/07/24 1500 01/07/24 1530 01/07/24 1600 01/07/24 1759  BP: (!) 152/78 (!) 163/73 (!) 147/84 (!) 164/81  Pulse: 81 77  83  Resp: 18 18  17   Temp:  98.6 F (37 C)  98.5 F (36.9 C)  TempSrc:  Oral    SpO2: 96% 96%  97%   General: Elderly female. Awake and alert and oriented x1. No acute cardiopulmonary distress.  HEENT: Normocephalic atraumatic.  Right  and left ears normal in appearance.  Pupils equal, round, reactive to light. Extraocular muscles are intact. Sclerae anicteric and noninjected.  Moist mucosal membranes. No mucosal lesions.  Neck: Neck supple without lymphadenopathy. No carotid bruits. No masses palpated.  Cardiovascular: Regular rate with normal S1-S2 sounds. No murmurs, rubs, gallops auscultated. No JVD.  Respiratory: Good respiratory effort with no wheezes, rales, rhonchi. Lungs clear to auscultation bilaterally.  No accessory muscle use. Abdomen: Soft, nontender, nondistended. Active bowel sounds. No masses or hepatosplenomegaly  Skin: No rashes, lesions, or ulcerations.  Dry, warm to touch. 2+ dorsalis  pedis and radial pulses. Musculoskeletal: No calf or leg pain. All major joints not erythematous nontender.  No upper or lower joint deformation.  Good ROM.  No contractures  Psychiatric: Lacks insight. Neurologic:  Strength is 5/5 and symmetric in upper and lower extremities.  Cranial nerves II through XII are grossly intact.  Data Reviewed: Labs and images reviewed  Assessment and Plan: No notes have been filed under this hospital service. Service: Hospitalist  Principal Problem:   Dementia [facility]) Active Problems:   Wrist fracture, closed, left, initial encounter   Essential hypertension   Memory loss   Stage 3b chronic kidney disease (HCC)  Left wrist fracture Splinted EDP discussed with Ortho -no need for emergent surgery unless develops numbness or loss of pulse Pain control PT/OT Patient does not have caregiver at home and will need placement Dementia On Namenda  and Aricept 's Stage III chronic kidney disease Creatinine slightly elevated.  Will hydrate Hypertension Continue antihypertensives   Advance Care Planning:   Code Status: Full Code unable to contact family, presumed full code as patient unable to specify for herself due to dementia  Consults: None  Family Communication: None  Severity of Illness: The appropriate patient status for this patient is OBSERVATION. Observation status is judged to be reasonable and necessary in order to provide the required intensity of service to ensure the patient's safety. The patient's presenting symptoms, physical exam findings, and initial radiographic and laboratory data in the context of their medical condition is felt to place them at decreased risk for further clinical deterioration. Furthermore, it is anticipated that the patient will be medically stable for discharge from the hospital within 2 midnights of admission.   Author: Liandro Thelin J Sufyaan Palma, DO 01/07/2024 6:10 PM  For on call review [email or web address].

## 2024-01-07 NOTE — ED Provider Notes (Addendum)
 This patient is 89 years old and presents after having a fall where she broke her forearm, she was immobilized by the physician this morning, unfortunately she lives by herself and does not have home health at this time, I did discuss the case with Dr. Romona who does not think the patient needs any emergent surgery unless the patient develops neurologic symptoms or severe pain neither of which she has at this time on my exam.  Will discuss with local hospitalist for admission  Dr. Barbra is agreeable to admit the patient to the hospital   Cleotilde Rogue, MD 01/07/24 1705    Cleotilde Rogue, MD 01/07/24 1711

## 2024-01-07 NOTE — ED Notes (Addendum)
 This writer walked into pt room to pt having gotten out of bed, pulled the wires out of the monitor and attempting to pull out IV, this Clinical research associate, another tech, and an Charity fundraiser into room to attempt to redirect pt, pt confused, adamant that she is going to walk home and that her uncle and brothers are at home waiting for her, MD notified and in room. Pt moved into hallway for safety so staff can see pt at all times.

## 2024-01-07 NOTE — Discharge Instructions (Addendum)
 It was a pleasure caring for you today in the emergency department.  Please call Dr. Romona on Monday to arrange follow-up in the office  Please return to the emergency department for any worsening or worrisome symptoms.

## 2024-01-07 NOTE — ED Notes (Signed)
 Pt sitting in the hallway in a recliner next to nurses desk. Continues to take off her sling and trys to remove her splint.

## 2024-01-07 NOTE — ED Notes (Signed)
 Kristen Best: [phone]

## 2024-01-07 NOTE — ED Provider Notes (Signed)
 [place] EMERGENCY DEPARTMENT AT [facility] Provider Note  CSN: [id number] Arrival date & time: 01/07/24 1043  Chief Complaint(s) Fall  HPI Kristen Best is a 89 y.o. female with past medical history as below, significant for depression, high cholesterol, appendectomy who presents to the ED with complaint of fall, wrist injury  Patient reports she got out of bed last night around 10 or 11 PM, her feet became entangled because it was dark in the room and she was wearing poor fitting slippers and she fell to the ground.  Fell onto her outstretched left arm.  Denies head injury or LOC. She was unable to get up off the floor.  Woke up this morning with significant pain to her left forearm and swelling. Not taking blood thinners, denies head injury, denies prior orthopedic intervention.  No numbness or tingling to her fingertips but does have reduced range of motion to her fingers and left hand secondary to pain, sig swelling  Past Medical History Past Medical History:  Diagnosis Date   Depression    High cholesterol    UTI (urinary tract infection)    There are no active problems to display for this patient.  Home Medication(s) Prior to Admission medications   Medication Sig Start Date End Date Taking? Authorizing Provider  citalopram (CELEXA) 20 MG tablet Take 20 mg daily by mouth.    [provider]  clonazePAM  (KLONOPIN ) 1 MG tablet Take 1 mg at bedtime by mouth.    [provider]  co-enzyme Q-10 30 MG capsule Take 30 mg 3 (three) times daily by mouth.    [provider]  fenofibrate  160 MG tablet Take 160 mg daily by mouth.    [provider]  lisinopril  (PRINIVIL ,ZESTRIL ) 10 MG tablet Take 10 mg daily by mouth.    [provider]  ondansetron  (ZOFRAN ) 4 MG tablet Take 1 tablet (4 mg total) every 8 (eight) hours as needed by mouth for nausea or vomiting. 05/05/17   Setzer, Veva CROME, NP                                                                                                                                     Past Surgical History Past Surgical History:  Procedure Laterality Date   APPENDECTOMY     Family History History reviewed. No pertinent family history.  Social History Social History   Tobacco Use   Smoking status: Former   Smokeless tobacco: Never  Substance Use Topics   Alcohol use: No   Drug use: No   Allergies Patient has no known allergies.  Review of Systems A thorough review of systems was obtained and all systems are negative except as noted in the HPI and PMH.   Physical Exam Vital Signs  I have reviewed the triage vital signs BP (!) 117/50 (BP Location: Right Arm)   Pulse 82   Temp 98.6 F (37  C) (Oral)   Resp 16   SpO2 96%  Physical Exam Vitals and nursing note reviewed.  Constitutional:      General: She is not in acute distress.    Appearance: Normal appearance. She is well-developed. She is not ill-appearing.  HENT:     Head: Normocephalic and atraumatic.     Right Ear: External ear normal.     Left Ear: External ear normal.     Nose: Nose normal.     Mouth/Throat:     Mouth: Mucous membranes are moist.  Eyes:     General: No scleral icterus.       Right eye: No discharge.        Left eye: No discharge.  Cardiovascular:     Rate and Rhythm: Normal rate.  Pulmonary:     Effort: Pulmonary effort is normal. No respiratory distress.     Breath sounds: No stridor.  Abdominal:     General: Abdomen is flat. There is no distension.     Tenderness: There is no guarding.  Musculoskeletal:        General: No deformity.       Arms:     Cervical back: No rigidity.  Skin:    General: Skin is warm and dry.     Coloration: Skin is not cyanotic, jaundiced or pale.  Neurological:     Mental Status: She is alert and oriented to person, place, and time.     GCS: GCS eye subscore is 4. GCS verbal subscore is 5. GCS motor subscore is 6.  Psychiatric:        Speech:  Speech normal.        Behavior: Behavior normal. Behavior is cooperative.     ED Results and Treatments Labs (all labs ordered are listed, but only abnormal results are displayed) Labs Reviewed - No data to display                                                                                                                        Radiology DG Forearm Left Result Date: 01/07/2024 CLINICAL DATA:  Fall. EXAM: PORTABLE CHEST 1 VIEW, left forearm COMPARISON:  None Available. FINDINGS: Chest: The heart size and mediastinal contours are within normal limits. Minimal atelectasis is noted at the left lung base. No effusion or pneumothorax is seen. Degenerative changes are present in the thoracic spine and bilateral shoulders. Left forearm: There is a comminuted of the distal radius with posterior displacement of the distal fracture fragments. A displaced ulnar styloid fracture is noted. Soft tissue swelling is present about the wrist and distal forearm. Degenerative changes are noted at the wrist and elbow. IMPRESSION: 1. Mild atelectasis at the left lung base. 2. Comminuted displaced fracture of the distal radius. 3. Displaced ulnar styloid fracture. Electronically Signed   By: Leita Birmingham M.D.   On: 01/07/2024 11:38   DG Chest Port 1 View Result Date: 01/07/2024 CLINICAL DATA:  Fall. EXAM: PORTABLE CHEST 1  VIEW, left forearm COMPARISON:  None Available. FINDINGS: Chest: The heart size and mediastinal contours are within normal limits. Minimal atelectasis is noted at the left lung base. No effusion or pneumothorax is seen. Degenerative changes are present in the thoracic spine and bilateral shoulders. Left forearm: There is a comminuted of the distal radius with posterior displacement of the distal fracture fragments. A displaced ulnar styloid fracture is noted. Soft tissue swelling is present about the wrist and distal forearm. Degenerative changes are noted at the wrist and elbow. IMPRESSION: 1. Mild  atelectasis at the left lung base. 2. Comminuted displaced fracture of the distal radius. 3. Displaced ulnar styloid fracture. Electronically Signed   By: Leita Birmingham M.D.   On: 01/07/2024 11:38    Pertinent labs & imaging results that were available during my care of the patient were reviewed by me and considered in my medical decision making (see MDM for details).  Medications Ordered in ED Medications  lidocaine -EPINEPHrine  (XYLOCAINE  W/EPI) 2 %-1:200000 (PF) injection 10 mL (has no administration in time range)  oxyCODONE  (Oxy IR/ROXICODONE ) immediate release tablet 5 mg (5 mg Oral Given 01/07/24 1201)                                                                                                                                     Procedures Procedures  (including critical care time)  Medical Decision Making / ED Course    Medical Decision Making:    Albertha Beattie is a 89 y.o. female  with past medical history as below, significant for depression, high cholesterol, appendectomy who presents to the ED with complaint of fall, wrist injury. The complaint involves an extensive differential diagnosis and also carries with it a high risk of complications and morbidity.  Serious etiology was considered. Ddx includes but is not limited to: sprain, strain fracture,  subdural hematoma, epidural hematoma, acute concussion, traumatic subarachnoid hemorrhage, cerebral contusions, etc.   Complete initial physical exam performed, notably the patient was in nad, sitting comfortably.    Reviewed and confirmed nursing documentation for past medical history, family history, social history.  Vital signs reviewed.    Fall Distal radius fx Ulnar styloid fx > -hx dementia,  lives at home w/ Best who is primary caretaker - foosh last pm with distal radius fx, attempted reduction and splinting; pt tolerated well, fairly large hematoma to wrist, NVI - pt unsafe to go home as her caretaker was recently  hospitalized, unsafe ambulation, hx dementia   Clinical Course as of 01/07/24 1649  Sat Jan 07, 2024  1138 Distal radius fx on xr [SG]  1340 Creatinine(!): 1.33 Similar to prior, give fluids [SG]  1552 Kristen Best Emergency Contact [phone], [phone] >> Best who is primary caretaker was recently hospitalized and she is home alone at this point. Patient is not safe to be at home alone given her hx dementia and fall last night. Place  under TOC boarder status, Best reports he will also see about placement at [facility] center where pt follows w/ her geriatric care [SG]    Clinical Course User Index [SG] Elnor Jayson LABOR, DO     ***               Additional history obtained: -Additional history obtained from {wsadditionalhistorian:28072} -External records from outside source obtained and reviewed including: Chart review including previous notes, labs, imaging, consultation notes including  ***   Lab Tests: -I ordered, reviewed, and interpreted labs.   The pertinent results include:   Labs Reviewed - No data to display  Notable for ***  EKG   EKG Interpretation Date/Time:    Ventricular Rate:    PR Interval:    QRS Duration:    QT Interval:    QTC Calculation:   R Axis:      Text Interpretation:           Imaging Studies ordered: I ordered imaging studies including *** I independently visualized the following imaging with scope of interpretation limited to determining acute life threatening conditions related to emergency care; findings noted above I agree with the radiologist interpretation If any imaging was obtained with contrast I closely monitored patient for any possible adverse reaction a/w contrast administration in the emergency department   Medicines ordered and prescription drug management: Meds ordered this encounter  Medications   oxyCODONE  (Oxy IR/ROXICODONE ) immediate release tablet 5 mg    Refill:  0   DISCONTD:  bupivacaine -epinephrine  (PF) (MARCAINE  W/ EPI) 0.5% -1:200000 injection 10 mL   lidocaine -EPINEPHrine  (XYLOCAINE  W/EPI) 2 %-1:200000 (PF) injection 10 mL    -I have reviewed the patients home medicines and have made adjustments as needed   Consultations Obtained: I requested consultation with the ***,  and discussed lab and imaging findings as well as pertinent plan - they recommend: ***   Cardiac Monitoring: The patient was maintained on a cardiac monitor.  I personally viewed and interpreted the cardiac monitored which showed an underlying rhythm of: *** Continuous pulse oximetry interpreted by myself, ***% on ***.    Social Determinants of Health:  Diagnosis or treatment significantly limited by social determinants of health: {wssoc:28071}   Reevaluation: After the interventions noted above, I reevaluated the patient and found that they have {resolved/improved/worsened:23923::improved}  Co morbidities that complicate the patient evaluation  Past Medical History:  Diagnosis Date   Depression    High cholesterol    UTI (urinary tract infection)       Dispostion: Disposition decision including need for hospitalization was considered, and patient {wsdispo:28070::discharged from emergency department.}    Final Clinical Impression(s) / ED Diagnoses Final diagnoses:  None

## 2024-01-08 DIAGNOSIS — S62102A Fracture of unspecified carpal bone, left wrist, initial encounter for closed fracture: Secondary | ICD-10-CM | POA: Diagnosis not present

## 2024-01-08 DIAGNOSIS — I10 Essential (primary) hypertension: Secondary | ICD-10-CM

## 2024-01-08 DIAGNOSIS — F0390 Unspecified dementia without behavioral disturbance: Secondary | ICD-10-CM | POA: Diagnosis not present

## 2024-01-08 DIAGNOSIS — N1832 Chronic kidney disease, stage 3b: Secondary | ICD-10-CM

## 2024-01-08 LAB — URINALYSIS, ROUTINE W REFLEX MICROSCOPIC
Bacteria, UA: NONE SEEN
Bilirubin Urine: NEGATIVE
Glucose, UA: 150 mg/dL — AB
Ketones, ur: NEGATIVE mg/dL
Leukocytes,Ua: NEGATIVE
Nitrite: NEGATIVE
Protein, ur: NEGATIVE mg/dL
Specific Gravity, Urine: 1.009 (ref 1.005–1.030)
pH: 6 (ref 5.0–8.0)

## 2024-01-08 LAB — GLUCOSE, CAPILLARY
Glucose-Capillary: 127 mg/dL — ABNORMAL HIGH (ref 70–99)
Glucose-Capillary: 146 mg/dL — ABNORMAL HIGH (ref 70–99)
Glucose-Capillary: 122 mg/dL — ABNORMAL HIGH (ref 70–99)
Glucose-Capillary: 129 mg/dL — ABNORMAL HIGH (ref 70–99)

## 2024-01-08 NOTE — Progress Notes (Signed)
 PROGRESS NOTE  Kristen Best FMW:[id number] DOB: [birth year] DOA: 01/07/2024 PCP: Patient, No Pcp Per  Brief History:  89 year old female with a history of hypertension, hyperlipidemia, dementia, anxiety/depression, CKD stage III, vitamin D deficiency presenting after mechanical fall.  The patient was getting out of bed on the evening of 01/06/2024 around 11 PM.  Patient states that she was wearing poorly fitting slippers, and she fell onto the ground on her outstretched left arm.  There was no LOC or head injury.  She had difficulty getting up off the floor.  She woke up in the morning with significant pain in her left forearm with swelling.  As result, she was brought to emergency department for further evaluation and treatment.  The patient denies any fevers, chills, chest pain, shortness of breath, abdominal pain. In the ED, the patient was afebrile and hemodynamically stable with oxygen saturation 95% room air.  WBC 9.0, hemoglobin 12.7, platelets 203.  Sodium 136, potassium 4.2, bicarbonate 21, serum creatinine 1.33.  CPK 238.  X-ray of the left forearm showed a comminuted distal radius fracture with posterior displacement.  There is also a displaced ulna styloid fracture.  Her fractures were reduced in the emergency department.  Repeat x-ray showed improved, near anatomic alignment of the comminuted fracture of the distal radius.  Orthopedic surgery,, Dr. Romona was consulted.  He did not feel the patient needed emergent surgery unless she developed uncontrolled pain or neurologic compromise.  Because the patient lives alone with unsafe discharge, she was mated for further evaluation and treatment.   Assessment/Plan: Left distal radius and ulna fracture - Reduced in the emergency department - Splint placed - EDP discussed with orthopedics, Dr. Robbi need for emergent surgery unless the patient has neurovascular compromise or uncontrolled pain - PT/OT  Major neurocognitive  disorder - Continue Namenda   Stage IIIb CKD - Baseline creatinine 1.1-1.3 - Monitor BMP  Essential hypertension - Continue lisinopril   Mixed hyperlipidemia - Continue statin  Diabetes mellitus type 2 - NovoLog  sliding scale - Check hemoglobin A1c        Family Communication:  no Family at bedside  Consultants:  none  Code Status:  FULL   DVT Prophylaxis:  [place] Lovenox    Procedures: As Listed in Progress Note Above  Antibiotics: None       Subjective: Patient states her left arm pain is just dull.  Denies any fevers, chills, chest pain, shortness breath, abdominal pain.  Objective: Vitals:   01/07/24 1806 01/07/24 2141 01/08/24 0200 01/08/24 0620  BP:  (!) 133/43 (!) 140/64 (!) 132/57  Pulse:  88 85 87  Resp:  20 18 19   Temp:  98.8 F (37.1 C) 97.8 F (36.6 C) 98.2 F (36.8 C)  TempSrc:  Oral Oral Oral  SpO2:  95% 94% 100%  Weight:      Height: 5' 4 (1.626 m)      No intake or output data in the 24 hours ending 01/08/24 0923 Weight change:  Exam:  General:  Pt is alert, follows commands appropriately, not in acute distress HEENT: No icterus, No thrush, No neck mass, [place]/AT Cardiovascular: RRR, S1/S2, no rubs, no gallops Respiratory: CTA bilaterally, no wheezing, no crackles, no rhonchi Abdomen: Soft/+BS, non tender, non distended, no guarding Extremities: Left arm in a splint.  Capillary refill less than 2 seconds.  Able to move her fingers.  Sensation intact.   Data Reviewed: I have personally reviewed following labs and imaging  studies Basic Metabolic Panel: Recent Labs  Lab 01/07/24 1107  NA 136  K 4.2  CL 103  CO2 21*  GLUCOSE 223*  BUN 24*  CREATININE 1.33*  CALCIUM  9.6   Liver Function Tests: No results for input(s): AST, ALT, ALKPHOS, BILITOT, PROT, ALBUMIN in the last 168 hours. No results for input(s): LIPASE, AMYLASE in the last 168 hours. No results for input(s): AMMONIA in the last 168  hours. Coagulation Profile: No results for input(s): INR, PROTIME in the last 168 hours. CBC: Recent Labs  Lab 01/07/24 1107  WBC 9.0  NEUTROABS 7.5  HGB 12.7  HCT 37.5  MCV 90.6  PLT 203   Cardiac Enzymes: Recent Labs  Lab 01/07/24 1107  CKTOTAL 238*   BNP: Invalid input(s): POCBNP CBG: Recent Labs  Lab 01/07/24 2139 01/08/24 0718  GLUCAP 225* 127*   HbA1C: No results for input(s): HGBA1C in the last 72 hours. Urine analysis:    Component Value Date/Time   COLORURINE COLORLESS (A) 12/26/2021 1406   APPEARANCEUR CLEAR 12/26/2021 1406   LABSPEC 1.003 (L) 12/26/2021 1406   PHURINE 6.0 12/26/2021 1406   GLUCOSEU NEGATIVE 12/26/2021 1406   HGBUR SMALL (A) 12/26/2021 1406   BILIRUBINUR NEGATIVE 12/26/2021 1406   KETONESUR 5 (A) 12/26/2021 1406   PROTEINUR NEGATIVE 12/26/2021 1406   NITRITE NEGATIVE 12/26/2021 1406   LEUKOCYTESUR NEGATIVE 12/26/2021 1406   Sepsis Labs: @LABRCNTIP (procalcitonin:4,lacticidven:4) )No results found for this or any previous visit (from the past 240 hours).   Scheduled Meds:  amitriptyline   50 mg Oral QHS   clonazePAM   1 mg Oral QHS   donepezil   5 mg Oral Daily   enoxaparin  (LOVENOX ) injection  30 mg Subcutaneous Q24H   insulin  aspart  0-15 Units Subcutaneous TID WC   insulin  aspart  0-5 Units Subcutaneous QHS   lisinopril   10 mg Oral Daily   memantine   10 mg Oral BID   rosuvastatin   20 mg Oral Daily   Continuous Infusions:  Procedures/Studies: DG Forearm Left Result Date: 01/07/2024 CLINICAL DATA:  Postreduction EXAM: LEFT FOREARM - 2 VIEW COMPARISON:  01/07/2024 FINDINGS: Improved alignment postreduction of the comminuted fracture of the distal radius, now near anatomic. Displaced ulnar styloid fracture. Soft tissue swelling about the wrist. IMPRESSION: Improved, near anatomic, alignment of the comminuted fracture of the distal radius after reduction. Electronically Signed   By: Norman Gatlin M.D.   On: 01/07/2024  17:56   DG Forearm Left Result Date: 01/07/2024 CLINICAL DATA:  Fall. EXAM: PORTABLE CHEST 1 VIEW, left forearm COMPARISON:  None Available. FINDINGS: Chest: The heart size and mediastinal contours are within normal limits. Minimal atelectasis is noted at the left lung base. No effusion or pneumothorax is seen. Degenerative changes are present in the thoracic spine and bilateral shoulders. Left forearm: There is a comminuted of the distal radius with posterior displacement of the distal fracture fragments. A displaced ulnar styloid fracture is noted. Soft tissue swelling is present about the wrist and distal forearm. Degenerative changes are noted at the wrist and elbow. IMPRESSION: 1. Mild atelectasis at the left lung base. 2. Comminuted displaced fracture of the distal radius. 3. Displaced ulnar styloid fracture. Electronically Signed   By: Leita Birmingham M.D.   On: 01/07/2024 11:38   DG Chest Port 1 View Result Date: 01/07/2024 CLINICAL DATA:  Fall. EXAM: PORTABLE CHEST 1 VIEW, left forearm COMPARISON:  None Available. FINDINGS: Chest: The heart size and mediastinal contours are within normal limits. Minimal atelectasis is noted at  the left lung base. No effusion or pneumothorax is seen. Degenerative changes are present in the thoracic spine and bilateral shoulders. Left forearm: There is a comminuted of the distal radius with posterior displacement of the distal fracture fragments. A displaced ulnar styloid fracture is noted. Soft tissue swelling is present about the wrist and distal forearm. Degenerative changes are noted at the wrist and elbow. IMPRESSION: 1. Mild atelectasis at the left lung base. 2. Comminuted displaced fracture of the distal radius. 3. Displaced ulnar styloid fracture. Electronically Signed   By: Leita Birmingham M.D.   On: 01/07/2024 11:38    Alm Schneider, DO  [facility]  If 7PM-7AM, please contact night-coverage [email or web address] Password [email or web address] 01/08/2024, 9:23 AM   LOS: 1 day

## 2024-01-08 NOTE — TOC Initial Note (Signed)
 Transition of Care [facility]) - Initial/Assessment Note   Patient Details  Name: Kristen Best MRN: [id number] Date of Birth: 05-10-1933  Transition of Care [facility]) CM/SW Contact:    Nena LITTIE Coffee, RN Phone Number: 01/08/2024, 1:04 PM  Clinical Narrative:                 Pt admitted c/left wrist fracture from fall at home. Pt lives alone. Son and his family live in [place] and if SNF is needed would like a facility close to them. Son is currently hospitalized in [place] c/unknown dc. Prior to hospitalization pt was independent, no equipment.   Kandance Yano (son) [phone]  Expected Discharge Plan: Skilled Nursing Facility Barriers to Discharge: Continued Medical Work up  Patient Goals and CMS Choice Patient states their goals for this hospitalization and ongoing recovery are:: Return home ASAP CMS Medicare.gov Compare Post Acute Care list provided to:: Patient Choice offered to / list presented to : Patient [place] ownership interest in [facility].provided to:: Patient   Expected Discharge Plan and Services In-house Referral: Clinical Social Work Discharge Planning Services: CM Consult Post Acute Care Choice: Skilled Nursing Facility Living arrangements for the past 2 months: Single Family Home       Prior Living Arrangements/Services Living arrangements for the past 2 months: Single Family Home Lives with:: Self Patient language and need for interpreter reviewed:: Yes Do you feel safe going back to the place where you live?: Yes      Need for Family Participation in Patient Care: Yes (Comment) Care giver support system in place?: Yes (comment)   Criminal Activity/Legal Involvement Pertinent to Current Situation/Hospitalization: No - Comment as needed  Activities of Daily Living   Permission Sought/Granted Permission sought to share information with : Case Manager, Family Supports, Oceanographer granted to share information  with : Yes, Verbal Permission Granted  Emotional Assessment Appearance:: Appears stated age Attitude/Demeanor/Rapport: Engaged Affect (typically observed): Appropriate, Calm Orientation: : Oriented to Self, Oriented to Place, Oriented to Situation Alcohol / Substance Use: Not Applicable Psych Involvement: No (comment)  Admission diagnosis:  Dementia (HCC) [F03.90] Fall, initial encounter [W19.XXXA] Closed fracture of distal end of left radius, unspecified fracture morphology, initial encounter [S52.502A] Dementia, unspecified dementia severity, unspecified dementia type, unspecified whether behavioral, psychotic, or mood disturbance or anxiety (HCC) [F03.90] Patient Active Problem List   Diagnosis Date Noted   Dementia (HCC) 01/07/2024   Wrist fracture, closed, left, initial encounter 01/07/2024   Essential hypertension 01/07/2024   Hyperlipidemia, unspecified 01/07/2024   Memory loss 01/07/2024   Stage 3b chronic kidney disease (HCC) 01/07/2024   PCP:  Patient, No Pcp Per Pharmacy:   [facility] - [place], [place] - [address] [place] [address] Phone: [phone] Fax: [phone]  Social Drivers of Health (SDOH) Social History: SDOH Screenings   Tobacco Use: Medium Risk (01/07/2024)   SDOH Interventions:   Readmission Risk Interventions    01/08/2024   12:55 PM  Readmission Risk Prevention Plan  Post Dischage Appt Complete  Medication Screening Complete  Transportation Screening Complete

## 2024-01-08 NOTE — [place] FL2 (Signed)
 [place]  MEDICAID FL2 LEVEL OF CARE FORM     IDENTIFICATION  Patient Name: Kristen Best Birthdate: [birth year] Sex: female Admission Date (Current Location): 01/07/2024  [facility] and [place] Number:  [employer] and Address:  [facility],  [address], [place] [address]      Provider Number: [id number]  Attending Physician Name and Address:  Evonnie Lenis, MD  Relative Name and Phone Number:  Tsion Inghram (son) [phone]    Current Level of Care: Hospital Recommended Level of Care: Skilled Nursing Facility Prior Approval Number:    Date Approved/Denied: 01/08/24 PASRR Number: [id number]  Discharge Plan: SNF    Current Diagnoses: Patient Active Problem List   Diagnosis Date Noted   Dementia (HCC) 01/07/2024   Wrist fracture, closed, left, initial encounter 01/07/2024   Essential hypertension 01/07/2024   Hyperlipidemia, unspecified 01/07/2024   Memory loss 01/07/2024   Stage 3b chronic kidney disease (HCC) 01/07/2024    Orientation RESPIRATION BLADDER Height & Weight     Self, Situation, Place  Normal Continent Weight: 57.6 kg Height:  5' 4 (162.6 cm)  BEHAVIORAL SYMPTOMS/MOOD NEUROLOGICAL BOWEL NUTRITION STATUS      Continent Diet (see dc summary)  AMBULATORY STATUS COMMUNICATION OF NEEDS Skin   Extensive Assist Verbally Skin abrasions (redness)                       Personal Care Assistance Level of Assistance  Bathing, Feeding, Dressing Bathing Assistance: Maximum assistance Feeding assistance: Limited assistance Dressing Assistance: Maximum assistance     Functional Limitations Info  Sight, Hearing, Speech Sight Info: Adequate Hearing Info: Adequate Speech Info: Adequate    SPECIAL CARE FACTORS FREQUENCY  PT (By licensed PT), OT (By licensed OT)     PT Frequency: 5x weekly OT Frequency: 5x weekly            Contractures Contractures Info: Not present    Additional Factors Info  Code Status,  Allergies Code Status Info: Full Allergies Info: None known           Current Medications (01/08/2024):  This is the current hospital active medication list Current Facility-Administered Medications  Medication Dose Route Frequency Provider Last Rate Last Admin   amitriptyline  (ELAVIL ) tablet 50 mg  50 mg Oral QHS Stinson, Jacob J, DO   50 mg at 01/07/24 2112   bisacodyl  (DULCOLAX) EC tablet 5 mg  5 mg Oral Daily PRN Stinson, Jacob J, DO       clonazePAM  (KLONOPIN ) tablet 1 mg  1 mg Oral QHS Stinson, Jacob J, DO   1 mg at 01/07/24 2112   donepezil  (ARICEPT ) tablet 5 mg  5 mg Oral Daily Stinson, Jacob J, DO   5 mg at 01/08/24 0848   enoxaparin  (LOVENOX ) injection 30 mg  30 mg Subcutaneous Q24H Stinson, Jacob J, DO   30 mg at 01/07/24 2112   insulin  aspart (novoLOG ) injection 0-15 Units  0-15 Units Subcutaneous TID WC Stinson, Jacob J, DO   2 Units at 01/08/24 1243   insulin  aspart (novoLOG ) injection 0-5 Units  0-5 Units Subcutaneous QHS Stinson, Jacob J, DO   2 Units at 01/07/24 2223   lisinopril  (ZESTRIL ) tablet 10 mg  10 mg Oral Daily Stinson, Jacob J, DO   10 mg at 01/08/24 0848   memantine  (NAMENDA ) tablet 10 mg  10 mg Oral BID Stinson, Jacob J, DO   10 mg at 01/08/24 0848   ondansetron  (ZOFRAN ) tablet  4 mg  4 mg Oral Q6H PRN Stinson, Jacob J, DO       Or   ondansetron  (ZOFRAN ) injection 4 mg  4 mg Intravenous Q6H PRN Stinson, Jacob J, DO       oxyCODONE  (Oxy IR/ROXICODONE ) immediate release tablet 5 mg  5 mg Oral Q4H PRN Stinson, Jacob J, DO   5 mg at 01/07/24 2122   rosuvastatin  (CRESTOR ) tablet 20 mg  20 mg Oral Daily Stinson, Jacob J, DO   20 mg at 01/08/24 9151     Discharge Medications: Please see discharge summary for a list of discharge medications.  Relevant Imaging Results:  Relevant Lab Results:   Additional Information SSN: [id number]  Nena LITTIE Coffee, RN

## 2024-01-08 NOTE — Hospital Course (Addendum)
 89 year old female with a history of hypertension, hyperlipidemia, dementia, anxiety/depression, CKD stage III, vitamin D deficiency presenting after mechanical fall.  The patient was getting out of bed on the evening of 01/06/2024 around 11 PM.  Patient states that she was wearing poorly fitting slippers, and she fell onto the ground on her outstretched left arm.  There was no LOC or head injury.  She had difficulty getting up off the floor.  She woke up in the morning with significant pain in her left forearm with swelling.  As result, she was brought to emergency department for further evaluation and treatment.  The patient denies any fevers, chills, chest pain, shortness of breath, abdominal pain. In the ED, the patient was afebrile and hemodynamically stable with oxygen saturation 95% room air.  WBC 9.0, hemoglobin 12.7, platelets 203.  Sodium 136, potassium 4.2, bicarbonate 21, serum creatinine 1.33.  CPK 238.  X-ray of the left forearm showed a comminuted distal radius fracture with posterior displacement.  There is also a displaced ulna styloid fracture.  Her fractures were reduced in the emergency department.  Repeat x-ray showed improved, near anatomic alignment of the comminuted fracture of the distal radius.  Orthopedic surgery,, Dr. Romona was consulted.  He did not feel the patient needed emergent surgery unless she developed uncontrolled pain or neurologic compromise.  Because the patient lives alone with unsafe discharge, she was mated for further evaluation and treatment.

## 2024-01-08 NOTE — Plan of Care (Signed)

## 2024-01-09 DIAGNOSIS — S62102A Fracture of unspecified carpal bone, left wrist, initial encounter for closed fracture: Secondary | ICD-10-CM | POA: Diagnosis not present

## 2024-01-09 DIAGNOSIS — W19XXXA Unspecified fall, initial encounter: Secondary | ICD-10-CM | POA: Diagnosis not present

## 2024-01-09 DIAGNOSIS — N1832 Chronic kidney disease, stage 3b: Secondary | ICD-10-CM | POA: Diagnosis not present

## 2024-01-09 LAB — BASIC METABOLIC PANEL WITH GFR
Anion gap: 11 (ref 5–15)
BUN: 15 mg/dL (ref 8–23)
CO2: 24 mmol/L (ref 22–32)
Calcium: 9.3 mg/dL (ref 8.9–10.3)
Chloride: 106 mmol/L (ref 98–111)
Creatinine, Ser: 1.14 mg/dL — ABNORMAL HIGH (ref 0.44–1.00)
GFR, Estimated: 46 mL/min — ABNORMAL LOW (ref >60)
Glucose, Bld: 103 mg/dL — ABNORMAL HIGH (ref 70–99)
Potassium: 3.5 mmol/L (ref 3.5–5.1)
Sodium: 141 mmol/L (ref 135–145)

## 2024-01-09 LAB — CBC
HCT: 40.6 % (ref 36.0–46.0)
Hemoglobin: 13.3 g/dL (ref 12.0–15.0)
MCH: 30 pg (ref 26.0–34.0)
MCHC: 32.8 g/dL (ref 30.0–36.0)
MCV: 91.6 fL (ref 80.0–100.0)
Platelets: 193 K/uL (ref 150–400)
RBC: 4.43 MIL/uL (ref 3.87–5.11)
RDW: 13.6 % (ref 11.5–15.5)
WBC: 8.1 K/uL (ref 4.0–10.5)
nRBC: 0 % (ref 0.0–0.2)

## 2024-01-09 LAB — HEMOGLOBIN A1C
Hgb A1c MFr Bld: 6.2 % — ABNORMAL HIGH (ref 4.8–5.6)
Mean Plasma Glucose: 131 mg/dL

## 2024-01-09 LAB — GLUCOSE, CAPILLARY
Glucose-Capillary: 116 mg/dL — ABNORMAL HIGH (ref 70–99)
Glucose-Capillary: 199 mg/dL — ABNORMAL HIGH (ref 70–99)
Glucose-Capillary: 118 mg/dL — ABNORMAL HIGH (ref 70–99)
Glucose-Capillary: 205 mg/dL — ABNORMAL HIGH (ref 70–99)

## 2024-01-09 LAB — MAGNESIUM: Magnesium: 2 mg/dL (ref 1.7–2.4)

## 2024-01-09 NOTE — TOC Progression Note (Signed)
 Transition of Care [facility]) - Progression Note    Patient Details  Name: Kristen Best MRN: [id number] Date of Birth: July 22, 1932  [facility] CM/SW Contact  Lucie Lunger, [place] Phone Number: 01/09/2024, 1:47 PM  Clinical Narrative:    CSW spoke with pts son to provide update that CSW is awaiting PT recommendation prior to being able to send out SNF referral. CSW spoke about facility preferences, they prefer [facility] as this is more local to family. CSW will send out referral and start auth once PT makes recommendations. TOC to follow.   Expected Discharge Plan: Skilled Nursing Facility Barriers to Discharge: Continued Medical Work up  Expected Discharge Plan and Services In-house Referral: Clinical Social Work Discharge Planning Services: CM Consult Post Acute Care Choice: Skilled Nursing Facility Living arrangements for the past 2 months: Single Family Home                                       Social Determinants of Health (SDOH) Interventions SDOH Screenings   Food Insecurity: Patient Unable To Answer (01/09/2024)  Housing: Unknown (01/09/2024)  Transportation Needs: No Transportation Needs (01/09/2024)  Utilities: Patient Unable To Answer (01/09/2024)  Social Connections: Unknown (01/09/2024)  Tobacco Use: Medium Risk (01/07/2024)    Readmission Risk Interventions    01/08/2024   12:55 PM  Readmission Risk Prevention Plan  Post Dischage Appt Complete  Medication Screening Complete  Transportation Screening Complete

## 2024-01-09 NOTE — Progress Notes (Signed)
 PROGRESS NOTE  Cathryn Gallery FMW:[id number] DOB: 09/03/1932 DOA: 01/07/2024 PCP: Patient, No Pcp Per  Brief History:  89 year old female with a history of hypertension, hyperlipidemia, dementia, anxiety/depression, CKD stage III, vitamin D deficiency presenting after mechanical fall.  The patient was getting out of bed on the evening of 01/06/2024 around 11 PM.  Patient states that she was wearing poorly fitting slippers, and she fell onto the ground on her outstretched left arm.  There was no LOC or head injury.  She had difficulty getting up off the floor.  She woke up in the morning with significant pain in her left forearm with swelling.  As result, she was brought to emergency department for further evaluation and treatment.  The patient denies any fevers, chills, chest pain, shortness of breath, abdominal pain. In the ED, the patient was afebrile and hemodynamically stable with oxygen saturation 95% room air.  WBC 9.0, hemoglobin 12.7, platelets 203.  Sodium 136, potassium 4.2, bicarbonate 21, serum creatinine 1.33.  CPK 238.  X-ray of the left forearm showed a comminuted distal radius fracture with posterior displacement.  There is also a displaced ulna styloid fracture.  Her fractures were reduced in the emergency department.  Repeat x-ray showed improved, near anatomic alignment of the comminuted fracture of the distal radius.  Orthopedic surgery,, Dr. Romona was consulted.  He did not feel the patient needed emergent surgery unless she developed uncontrolled pain or neurologic compromise.  Because the patient lives alone with unsafe discharge, she was mated for further evaluation and treatment.   Assessment/Plan:  Left distal radius and ulna fracture - Reduced in the emergency department - Splint placed - EDP discussed with orthopedics, Dr. Robbi need for emergent surgery unless the patient has neurovascular compromise or uncontrolled pain - PT/OT>>SNF   Major  neurocognitive disorder - Continue Namenda    Stage IIIb CKD - Baseline creatinine 1.1-1.3 - Monitor BMP   Essential hypertension - Continue lisinopril    Mixed hyperlipidemia - Continue statin   Diabetes mellitus type 2 - 01/08/24 hemoglobin A1c 6.2 - d/c sliding scale               Family Communication:  no Family at bedside   Consultants:  none   Code Status:  FULL    DVT Prophylaxis:  [place] Lovenox      Procedures: As Listed in Progress Note Above   Antibiotics: None         Subjective: Pt has some mild pain in left hand and wrist.  Denies f/c, cp,sob  n/v/d, abd pain.  Objective: Vitals:   01/08/24 1228 01/08/24 2046 01/09/24 0455 01/09/24 1442  BP: (!) 114/53 (!) 151/78 129/62 (!) 108/58  Pulse: 85 87 85 96  Resp: 17 18 18 16   Temp: 98 F (36.7 C) 98.5 F (36.9 C) 98.4 F (36.9 C)   TempSrc: Oral Oral Oral   SpO2: 95% 93% 94% 96%  Weight:      Height:        Intake/Output Summary (Last 24 hours) at 01/09/2024 1618 Last data filed at 01/08/2024 1726 Gross per 24 hour  Intake 200 ml  Output --  Net 200 ml   Weight change:  Exam:  General:  Pt is alert, follows commands appropriately, not in acute distress HEENT: No icterus, No thrush, No neck mass, [place]/AT Cardiovascular: RRR, S1/S2, no rubs, no gallops Respiratory: CTA bilaterally, no wheezing, no crackles, no rhonchi Abdomen: Soft/+BS, non tender, non  distended, no guarding Extremities: No edema, No lymphangitis, No petechiae, No rashes, no synovitis;  left hand cap refill < 2 sec.  Able to move all fingers.  Sensation intact   Data Reviewed: I have personally reviewed following labs and imaging studies Basic Metabolic Panel: Recent Labs  Lab 01/07/24 1107 01/09/24 0353  NA 136 141  K 4.2 3.5  CL 103 106  CO2 21* 24  GLUCOSE 223* 103*  BUN 24* 15  CREATININE 1.33* 1.14*  CALCIUM  9.6 9.3  MG  --  2.0   Liver Function Tests: No results for input(s): AST, ALT, ALKPHOS,  BILITOT, PROT, ALBUMIN in the last 168 hours. No results for input(s): LIPASE, AMYLASE in the last 168 hours. No results for input(s): AMMONIA in the last 168 hours. Coagulation Profile: No results for input(s): INR, PROTIME in the last 168 hours. CBC: Recent Labs  Lab 01/07/24 1107 01/09/24 0353  WBC 9.0 8.1  NEUTROABS 7.5  --   HGB 12.7 13.3  HCT 37.5 40.6  MCV 90.6 91.6  PLT 203 193   Cardiac Enzymes: Recent Labs  Lab 01/07/24 1107  CKTOTAL 238*   BNP: Invalid input(s): POCBNP CBG: Recent Labs  Lab 01/08/24 1108 01/08/24 1611 01/08/24 2134 01/09/24 0728 01/09/24 1109  GLUCAP 146* 122* 129* 116* 199*   HbA1C: Recent Labs    01/08/24 0329  HGBA1C 6.2*   Urine analysis:    Component Value Date/Time   COLORURINE STRAW (A) 01/07/2024 1254   APPEARANCEUR CLEAR 01/07/2024 1254   LABSPEC 1.009 01/07/2024 1254   PHURINE 6.0 01/07/2024 1254   GLUCOSEU 150 (A) 01/07/2024 1254   HGBUR SMALL (A) 01/07/2024 1254   BILIRUBINUR NEGATIVE 01/07/2024 1254   KETONESUR NEGATIVE 01/07/2024 1254   PROTEINUR NEGATIVE 01/07/2024 1254   NITRITE NEGATIVE 01/07/2024 1254   LEUKOCYTESUR NEGATIVE 01/07/2024 1254   Sepsis Labs: @LABRCNTIP (procalcitonin:4,lacticidven:4) )No results found for this or any previous visit (from the past 240 hours).   Scheduled Meds:  amitriptyline   50 mg Oral QHS   clonazePAM   1 mg Oral QHS   donepezil   5 mg Oral Daily   enoxaparin  (LOVENOX ) injection  30 mg Subcutaneous Q24H   insulin  aspart  0-15 Units Subcutaneous TID WC   insulin  aspart  0-5 Units Subcutaneous QHS   lisinopril   10 mg Oral Daily   memantine   10 mg Oral BID   rosuvastatin   20 mg Oral Daily   Continuous Infusions:  Procedures/Studies: DG Forearm Left Result Date: 01/07/2024 CLINICAL DATA:  Postreduction EXAM: LEFT FOREARM - 2 VIEW COMPARISON:  01/07/2024 FINDINGS: Improved alignment postreduction of the comminuted fracture of the distal radius, now near  anatomic. Displaced ulnar styloid fracture. Soft tissue swelling about the wrist. IMPRESSION: Improved, near anatomic, alignment of the comminuted fracture of the distal radius after reduction. Electronically Signed   By: Norman Gatlin M.D.   On: 01/07/2024 17:56   DG Forearm Left Result Date: 01/07/2024 CLINICAL DATA:  Fall. EXAM: PORTABLE CHEST 1 VIEW, left forearm COMPARISON:  None Available. FINDINGS: Chest: The heart size and mediastinal contours are within normal limits. Minimal atelectasis is noted at the left lung base. No effusion or pneumothorax is seen. Degenerative changes are present in the thoracic spine and bilateral shoulders. Left forearm: There is a comminuted of the distal radius with posterior displacement of the distal fracture fragments. A displaced ulnar styloid fracture is noted. Soft tissue swelling is present about the wrist and distal forearm. Degenerative changes are noted at the wrist and  elbow. IMPRESSION: 1. Mild atelectasis at the left lung base. 2. Comminuted displaced fracture of the distal radius. 3. Displaced ulnar styloid fracture. Electronically Signed   By: Leita Birmingham M.D.   On: 01/07/2024 11:38   DG Chest Port 1 View Result Date: 01/07/2024 CLINICAL DATA:  Fall. EXAM: PORTABLE CHEST 1 VIEW, left forearm COMPARISON:  None Available. FINDINGS: Chest: The heart size and mediastinal contours are within normal limits. Minimal atelectasis is noted at the left lung base. No effusion or pneumothorax is seen. Degenerative changes are present in the thoracic spine and bilateral shoulders. Left forearm: There is a comminuted of the distal radius with posterior displacement of the distal fracture fragments. A displaced ulnar styloid fracture is noted. Soft tissue swelling is present about the wrist and distal forearm. Degenerative changes are noted at the wrist and elbow. IMPRESSION: 1. Mild atelectasis at the left lung base. 2. Comminuted displaced fracture of the distal  radius. 3. Displaced ulnar styloid fracture. Electronically Signed   By: Leita Birmingham M.D.   On: 01/07/2024 11:38    Alm Schneider, DO  [facility]  If 7PM-7AM, please contact night-coverage [email or web address] Password [email or web address] 01/09/2024, 4:18 PM   LOS: 2 days

## 2024-01-09 NOTE — Plan of Care (Signed)
  Problem: Acute Rehab PT Goals(only PT should resolve) Goal: Pt Will Go Supine/Side To Sit Outcome: Progressing Flowsheets (Taken 01/09/2024 1522) Pt will go Supine/Side to Sit:  with contact guard assist  with minimal assist Goal: Patient Will Perform Sitting Balance Outcome: Progressing Flowsheets (Taken 01/09/2024 1522) Patient will perform sitting balance:  with contact guard assist  with minimal assist Goal: Pt Will Transfer Bed To Chair/Chair To Bed Outcome: Progressing Flowsheets (Taken 01/09/2024 1522) Pt will Transfer Bed to Chair/Chair to Bed: with min assist Goal: Pt Will Ambulate Outcome: Progressing Flowsheets (Taken 01/09/2024 1522) Pt will Ambulate:  50 feet  with minimal assist Note: Left Platform Walker   3:23 PM, 01/09/24 Lynwood Music, MPT Physical Therapist with [facility] [phone] office [phone] mobile phone

## 2024-01-09 NOTE — Plan of Care (Signed)
   Problem: Education: Goal: Knowledge of General Education information will improve Description Including pain rating scale, medication(s)/side effects and non-pharmacologic comfort measures Outcome: Progressing   Problem: Health Behavior/Discharge Planning: Goal: Ability to manage health-related needs will improve Outcome: Progressing

## 2024-01-09 NOTE — Evaluation (Signed)
 Physical Therapy Evaluation Patient Details Name: Kristen Best MRN: [id number] DOB: [birth year] Today's Date: 01/09/2024  History of Present Illness  Kristen Best is a 89 y.o. female with medical history significant of dementia, hypertension, hyperlipidemia, diabetes.  Patient lives with caregiver who was recently hospitalized and will have a prolonged hospitalization.  Patient was home last night and got out of bed.  She ended up falling onto her outstretched hand.  No head injury or loss of consciousness.  She continues to have pain in her left wrist and was brought to the emergency department for evaluation.  No numbness or tingling in the left hand.  She has nobody else to care for her at home.   Clinical Impression  Patient presents with splint to left wrist, requires repeated verbal/tactile cueing for following directions, demonstrates slow labored movement during transfers, taking steps using left platform walker and limited mostly due to fatigue and fall risk. Patient tolerated sitting up in chair after therapy - nursing notified. Patient will benefit from continued skilled physical therapy in hospital and recommended venue below to increase strength, balance, endurance for safe ADLs and gait.        If plan is discharge home, recommend the following: A lot of help with bathing/dressing/bathroom;A lot of help with walking and/or transfers;Help with stairs or ramp for entrance;Assistance with cooking/housework   Can travel by private vehicle   Yes    Equipment Recommendations None recommended by PT  Recommendations for Other Services       Functional Status Assessment Patient has had a recent decline in their functional status and demonstrates the ability to make significant improvements in function in a reasonable and predictable amount of time.     Precautions / Restrictions Precautions Precautions: Fall Recall of Precautions/Restrictions: Impaired Restrictions Weight  Bearing Restrictions Per Provider Order: No      Mobility  Bed Mobility Overal bed mobility: Needs Assistance Bed Mobility: Supine to Sit     Supine to sit: Min assist     General bed mobility comments: limited use of LUE due to wrist fracture with splint    Transfers Overall transfer level: Needs assistance Equipment used: Left platform walker Transfers: Sit to/from Stand, Bed to chair/wheelchair/BSC Sit to Stand: Min assist, Mod assist   Step pivot transfers: Min assist, Mod assist       General transfer comment: unsteady labored movement using platform walker    Ambulation/Gait Ambulation/Gait assistance: Mod assist, Min assist Gait Distance (Feet): 15 Feet Assistive device: Left platform walker Gait Pattern/deviations: Decreased step length - right, Decreased step length - left, Decreased stride length Gait velocity: slow     General Gait Details: required assistance for keeping LUE on platform walker, slow labored movement, limited mostly due to fatigue and increasing left wrist/hand pain  Stairs            Wheelchair Mobility     Tilt Bed    Modified Rankin (Stroke Patients Only)       Balance Overall balance assessment: Needs assistance Sitting-balance support: Feet supported, No upper extremity supported Sitting balance-Leahy Scale: Fair Sitting balance - Comments: fair/good seated at EOB   Standing balance support: During functional activity, Bilateral upper extremity supported Standing balance-Leahy Scale: Poor Standing balance comment: fair/poor using platform walker                             Pertinent Vitals/Pain Pain Assessment Pain Assessment: Faces Faces Pain Scale:  Hurts little more Pain Location: left wrist/hand (in splint) Pain Descriptors / Indicators: Discomfort, Sore Pain Intervention(s): Limited activity within patient's tolerance, Monitored during session, Repositioned    Home Living Family/patient  expects to be discharged to:: Private residence Living Arrangements: Other (Comment) (caregiver) Available Help at Discharge: Personal care attendant;Available PRN/intermittently Type of Home: House         Home Layout: One level   Additional Comments: Patient is poor historian    Prior Function Prior Level of Function : Needs assist       Physical Assist : Mobility (physical);ADLs (physical) Mobility (physical): Bed mobility;Transfers;Gait;Stairs   Mobility Comments: household ambulation without AD ADLs Comments: Assisted by care giver     Extremity/Trunk Assessment   Upper Extremity Assessment Upper Extremity Assessment: Defer to OT evaluation    Lower Extremity Assessment Lower Extremity Assessment: Generalized weakness    Cervical / Trunk Assessment Cervical / Trunk Assessment: Kyphotic  Communication   Communication Communication: No apparent difficulties    Cognition Arousal: Alert     PT - Cognitive impairments: History of cognitive impairments, Problem solving                       PT - Cognition Comments: requires repeated verbal/tactile cueing for following directions Following commands: Impaired Following commands impaired: Follows one step commands inconsistently     Cueing Cueing Techniques: Verbal cues     General Comments      Exercises     Assessment/Plan    PT Assessment Patient needs continued PT services  PT Problem List Decreased strength;Decreased activity tolerance;Decreased balance;Decreased mobility       PT Treatment Interventions DME instruction;Gait training;Stair training;Functional mobility training;Therapeutic activities;Therapeutic exercise;Balance training;Patient/family education    PT Goals (Current goals can be found in the Care Plan section)  Acute Rehab PT Goals Patient Stated Goal: return home PT Goal Formulation: With patient Time For Goal Achievement: 01/23/24 Potential to Achieve Goals: Good     Frequency Min 3X/week     Co-evaluation               AM-PAC PT 6 Clicks Mobility  Outcome Measure Help needed turning from your back to your side while in a flat bed without using bedrails?: A Little Help needed moving from lying on your back to sitting on the side of a flat bed without using bedrails?: A Little Help needed moving to and from a bed to a chair (including a wheelchair)?: A Lot Help needed standing up from a chair using your arms (e.g., wheelchair or bedside chair)?: A Little Help needed to walk in hospital room?: A Lot Help needed climbing 3-5 steps with a railing? : A Lot 6 Click Score: 15    End of Session   Activity Tolerance: Patient tolerated treatment well;Patient limited by fatigue Patient left: in chair;with call bell/phone within reach;with chair alarm set Nurse Communication: Mobility status PT Visit Diagnosis: Unsteadiness on feet (R26.81);Other abnormalities of gait and mobility (R26.89);Muscle weakness (generalized) (M62.81)    Time: 9048-8981 PT Time Calculation (min) (ACUTE ONLY): 27 min   Charges:   PT Evaluation $PT Eval Moderate Complexity: 1 Mod PT Treatments $Therapeutic Activity: 23-37 mins PT General Charges $$ ACUTE PT VISIT: 1 Visit         3:19 PM, 01/09/24 Lynwood Music, MPT Physical Therapist with [facility] [phone] office [phone] mobile phone

## 2024-01-10 DIAGNOSIS — N1832 Chronic kidney disease, stage 3b: Secondary | ICD-10-CM | POA: Diagnosis not present

## 2024-01-10 DIAGNOSIS — F0390 Unspecified dementia without behavioral disturbance: Secondary | ICD-10-CM | POA: Diagnosis not present

## 2024-01-10 DIAGNOSIS — S62102A Fracture of unspecified carpal bone, left wrist, initial encounter for closed fracture: Secondary | ICD-10-CM | POA: Diagnosis not present

## 2024-01-10 LAB — GLUCOSE, CAPILLARY
Glucose-Capillary: 95 mg/dL (ref 70–99)
Glucose-Capillary: 132 mg/dL — ABNORMAL HIGH (ref 70–99)
Glucose-Capillary: 125 mg/dL — ABNORMAL HIGH (ref 70–99)
Glucose-Capillary: 133 mg/dL — ABNORMAL HIGH (ref 70–99)

## 2024-01-10 MED ORDER — TRAMADOL HCL 50 MG PO TABS: 50.0000 mg | ORAL_TABLET | Freq: Four times a day (QID) | ORAL | Status: DC | PRN

## 2024-01-10 MED ORDER — TRAMADOL HCL 50 MG PO TABS
50.0000 mg | ORAL_TABLET | Freq: Four times a day (QID) | ORAL | 0 refills | Status: DC | PRN
Start: 2024-01-10 — End: 2024-01-11

## 2024-01-10 MED ORDER — ACETAMINOPHEN 500 MG PO TABS
1000.0000 mg | ORAL_TABLET | Freq: Three times a day (TID) | ORAL | Status: DC
  Administered 2024-01-11 – 2024-01-12 (×2): 1000 mg via ORAL
  Filled 2024-01-10 (×4): qty 2

## 2024-01-10 MED ORDER — ACETAMINOPHEN 500 MG PO TABS
1000.0000 mg | ORAL_TABLET | Freq: Three times a day (TID) | ORAL | Status: AC
Start: 2024-01-10 — End: ?

## 2024-01-10 NOTE — Plan of Care (Signed)

## 2024-01-10 NOTE — Evaluation (Signed)
 Occupational Therapy Evaluation Patient Details Name: Kristen Best MRN: [id number] DOB: 16-Oct-1932 Today's Date: 01/10/2024   History of Present Illness   Kristen Best is a 89 y.o. female with medical history significant of dementia, hypertension, hyperlipidemia, diabetes.  Patient lives with caregiver who was recently hospitalized and will have a prolonged hospitalization.  Patient was home last night and got out of bed.  She ended up falling onto her outstretched hand.  No head injury or loss of consciousness.  She continues to have pain in her left wrist and was brought to the emergency department for evaluation.  No numbness or tingling in the left hand.  She has nobody else to care for her at home.     Clinical Impressions Pt agreeable to OT evaluation. Pt pleasantly confused and followed commands well. Able to ambulate in the room without the platform walker. Pt attempted platform walker at first but then attempted without and did fairly well. CGA for ambulation to the toilet. Assist needed for lower body ADL's at this time due to limited L UE functional use and L shoulder weakness. Pt was left in the chair with the chair alarm set and call bell within reach. Pt will benefit from continued OT in the hospital and recommended venue below to increase strength, balance, and endurance for safe ADL's.        If plan is discharge home, recommend the following:   A little help with walking and/or transfers;A lot of help with bathing/dressing/bathroom;Assistance with cooking/housework;Direct supervision/assist for medications management;Assist for transportation;Help with stairs or ramp for entrance     Functional Status Assessment   Patient has had a recent decline in their functional status and demonstrates the ability to make significant improvements in function in a reasonable and predictable amount of time.     Equipment Recommendations   None recommended by OT              Precautions/Restrictions   Precautions Precautions: Fall Recall of Precautions/Restrictions: Impaired Restrictions Weight Bearing Restrictions Per Provider Order: No     Mobility Bed Mobility Overal bed mobility: Needs Assistance Bed Mobility: Supine to Sit     Supine to sit: Min assist     General bed mobility comments: Single hand held assist to pull to sit.    Transfers Overall transfer level: Needs assistance   Transfers: Sit to/from Stand, Bed to chair/wheelchair/BSC Sit to Stand: Contact guard assist     Step pivot transfers: Contact guard assist     General transfer comment: unsteady; leaning on chair; no RW for transfer to chair.      Balance Overall balance assessment: Needs assistance Sitting-balance support: Feet supported, No upper extremity supported Sitting balance-Leahy Scale: Good Sitting balance - Comments: seated at EOB   Standing balance support: No upper extremity supported, During functional activity Standing balance-Leahy Scale: Fair Standing balance comment: fair without AD                           ADL either performed or assessed with clinical judgement   ADL Overall ADL's : Needs assistance/impaired     Grooming: Contact guard assist;Standing;Wash/dry hands   Upper Body Bathing: Set up;Sitting   Lower Body Bathing: Moderate assistance;Sitting/lateral leans   Upper Body Dressing : Set up;Sitting   Lower Body Dressing: Moderate assistance;Maximal assistance;Sitting/lateral leans Lower Body Dressing Details (indicate cue type and reason): Pt able to doff socks seated in the chair but required assist to  don. Toilet Transfer: Electronics engineer Details (indicate cue type and reason): Pt able to ambulate without the platform walker within room with CGA assist. Toileting- Clothing Manipulation and Hygiene: Contact guard assist;Minimal assistance;Sit to/from stand Toileting - Clothing  Manipulation Details (indicate cue type and reason): Pt able to wipe while assisted in standing. Assist to pull up pants. Pt seemed to forget. Able to pull down pants with CGA.     Functional mobility during ADLs: Contact guard assist General ADL Comments: Ambulated to the toilet and sink and back to chair.     Vision Baseline Vision/History: 1 Wears glasses Ability to See in Adequate Light: 1 Impaired Patient Visual Report: No change from baseline Vision Assessment?: Wears glasses for reading     Perception Perception: Not tested       Praxis Praxis: Not tested       Pertinent Vitals/Pain Pain Assessment Pain Assessment: No/denies pain     Extremity/Trunk Assessment Upper Extremity Assessment Upper Extremity Assessment: RUE deficits/detail;LUE deficits/detail RUE Deficits / Details: generalized weakness LUE Deficits / Details: Pt in splint from wrist to forearm. Difficult to assessa at this time give the splint. Swelling noted in L hand. 2+/5 shoulder flexion; 3-/5 elbow flexion. LUE Coordination: decreased fine motor   Lower Extremity Assessment Lower Extremity Assessment: Defer to PT evaluation   Cervical / Trunk Assessment Cervical / Trunk Assessment: Kyphotic   Communication Communication Communication: No apparent difficulties   Cognition Arousal: Alert Behavior During Therapy: WFL for tasks assessed/performed Cognition: History of cognitive impairments             OT - Cognition Comments: Pleasantly confused.                 Following commands: Intact       Cueing  General Comments   Cueing Techniques: Verbal cues                 Home Living Family/patient expects to be discharged to:: Private residence Living Arrangements: Other (Comment) Available Help at Discharge: Personal care attendant;Available PRN/intermittently Type of Home: House       Home Layout: One level                   Additional Comments: Patient is  poor historian (info per chart)      Prior Functioning/Environment Prior Level of Function : Needs assist       Physical Assist : ADLs (physical)     Mobility Comments: household ambulation without AD (per chart) ADLs Comments: Assisted by care giver (per chart)    OT Problem List: Decreased strength;Decreased range of motion;Impaired balance (sitting and/or standing);Decreased cognition;Decreased safety awareness;Impaired UE functional use   OT Treatment/Interventions: Self-care/ADL training;Therapeutic exercise;DME and/or AE instruction;Therapeutic activities;Balance training;Patient/family education      OT Goals(Current goals can be found in the care plan section)   Acute Rehab OT Goals Patient Stated Goal: go home OT Goal Formulation: With patient Time For Goal Achievement: 01/24/24 Potential to Achieve Goals: Good   OT Frequency:  Min 1X/week                                   End of Session Equipment Utilized During Treatment: Rolling walker (2 wheels);Gait belt Nurse Communication: Mobility status  Activity Tolerance: Patient tolerated treatment well Patient left: in chair;with call bell/phone within reach;with chair alarm set  OT Visit Diagnosis: Unsteadiness on  feet (R26.81);Other abnormalities of gait and mobility (R26.89);Muscle weakness (generalized) (M62.81);History of falling (Z91.81)                Time: 9060-8998 OT Time Calculation (min): 22 min Charges:  OT General Charges $OT Visit: 1 Visit OT Evaluation $OT Eval Low Complexity: 1 Low  Leocadia Idleman OT, MOT   Jayson Person 01/10/2024, 10:48 AM

## 2024-01-10 NOTE — Plan of Care (Signed)
  Problem: Acute Rehab OT Goals (only OT should resolve) Goal: Pt. Will Perform Grooming Flowsheets (Taken 01/10/2024 1051) Pt Will Perform Grooming: with modified independence Goal: Pt. Will Perform Lower Body Bathing Flowsheets (Taken 01/10/2024 1051) Pt Will Perform Lower Body Bathing: with modified independence Goal: Pt. Will Perform Lower Body Dressing Flowsheets (Taken 01/10/2024 1051) Pt Will Perform Lower Body Dressing: with modified independence Goal: Pt. Will Perform Toileting-Clothing Manipulation Flowsheets (Taken 01/10/2024 1051) Pt Will Perform Toileting - Clothing Manipulation and hygiene: with modified independence Goal: Pt/Caregiver Will Perform Home Exercise Program Flowsheets (Taken 01/10/2024 1051) Pt/caregiver will Perform Home Exercise Program:  Increased ROM  Increased strength  Right Upper extremity  Left upper extremity  Independently  Mariabella Nilsen OT, MOT

## 2024-01-10 NOTE — TOC Progression Note (Addendum)
 Transition of Care [facility]) - Progression Note    Patient Details  Name: Kristen Best MRN: [id number] Date of Birth: [birth year]  [facility] CM/SW Contact  Lucie Lunger, [place] Phone Number: 01/10/2024, 1:08 PM  Clinical Narrative:    CSW spoke with pts son to review bed offers, requested that bed offers be sent via text, CSW sent offering facility names to pts son via text. Pts son will review and update CSW. Insurance auth pending at this time. TOC to follow.   Addendum: CSW updated by pts son that they would like to accept bed at [facility]. CSW updated HUB to reflect bed choice. CSW also reached out to Kristin in admissions to update on accepted bed. TOC to follow.   Addendum: CSW updated by [facility]in in admissions at [facility] that they have filled their last bed and will not have anything until next Monday. CSW updated pts son of this and requested second choice for SNF facility. TOC to follow.   Expected Discharge Plan: Skilled Nursing Facility Barriers to Discharge: Continued Medical Work up  Expected Discharge Plan and Services In-house Referral: Clinical Social Work Discharge Planning Services: CM Consult Post Acute Care Choice: Skilled Nursing Facility Living arrangements for the past 2 months: Single Family Home                                       Social Determinants of Health (SDOH) Interventions SDOH Screenings   Food Insecurity: Patient Unable To Answer (01/09/2024)  Housing: Unknown (01/09/2024)  Transportation Needs: No Transportation Needs (01/09/2024)  Utilities: Patient Unable To Answer (01/09/2024)  Social Connections: Unknown (01/09/2024)  Tobacco Use: Medium Risk (01/07/2024)    Readmission Risk Interventions    01/08/2024   12:55 PM  Readmission Risk Prevention Plan  Post Dischage Appt Complete  Medication Screening Complete  Transportation Screening Complete

## 2024-01-10 NOTE — Discharge Summary (Addendum)
 Physician Discharge Summary   Patient: Kristen Best MRN: [id number] DOB: 09-17-1932  Admit date:     01/07/2024  Discharge date: 01/10/24  Discharge Physician: Alm Maxmilian Trostel   PCP: Patient, No Pcp Per   Recommendations at discharge:   Please follow up with primary care provider within 1-2 weeks  Please repeat BMP and CBC in one week    Hospital Course: 89 year old female with a history of hypertension, hyperlipidemia, dementia, anxiety/depression, CKD stage III, vitamin D deficiency presenting after mechanical fall.  The patient was getting out of bed on the evening of 01/06/2024 around 11 PM.  Patient states that she was wearing poorly fitting slippers, and she fell onto the ground on her outstretched left arm.  There was no LOC or head injury.  She had difficulty getting up off the floor.  She woke up in the morning with significant pain in her left forearm with swelling.  As result, she was brought to emergency department for further evaluation and treatment.  The patient denies any fevers, chills, chest pain, shortness of breath, abdominal pain. In the ED, the patient was afebrile and hemodynamically stable with oxygen saturation 95% room air.  WBC 9.0, hemoglobin 12.7, platelets 203.  Sodium 136, potassium 4.2, bicarbonate 21, serum creatinine 1.33.  CPK 238.  X-ray of the left forearm showed a comminuted distal radius fracture with posterior displacement.  There is also a displaced ulna styloid fracture.  Her fractures were reduced in the emergency department.  Repeat x-ray showed improved, near anatomic alignment of the comminuted fracture of the distal radius.  Orthopedic surgery,, Dr. Romona was consulted.  He did not feel the patient needed emergent surgery unless she developed uncontrolled pain or neurologic compromise.  Because the patient lives alone with unsafe discharge, she was mated for further evaluation and treatment.  Assessment and Plan: Left distal radius and ulna  fracture - Reduced in the emergency department - Splint placed - EDP discussed with orthopedics, Dr. Robbi need for emergent surgery unless the patient has neurovascular compromise or uncontrolled pain - PT/OT>>SNF - remains neurovascular intact, pain controlled   Major neurocognitive disorder - Continue Namenda    Stage IIIb CKD - Baseline creatinine 1.1-1.3 - Monitor BMP   Essential hypertension - disContinue lisinopril  due to soft BPs   Mixed hyperlipidemia - Continue statin   Diabetes mellitus type 2 - 01/08/24 hemoglobin A1c 6.2 - d/c sliding scale         Consultants: Orthopedics--[place] Procedures performed: none  Disposition: Skilled nursing facility Diet recommendation:  Regular diet DISCHARGE MEDICATION: Allergies as of 01/10/2024   No Known Allergies      Medication List     STOP taking these medications    citalopram 20 MG tablet Commonly known as: CELEXA   fenofibrate  160 MG tablet   lisinopril  10 MG tablet Commonly known as: ZESTRIL        TAKE these medications    acetaminophen  500 MG tablet Commonly known as: TYLENOL  Take 2 tablets (1,000 mg total) by mouth every 8 (eight) hours.   amitriptyline  50 MG tablet Commonly known as: ELAVIL  Take 50 mg by mouth at bedtime.   clonazePAM  1 MG tablet Commonly known as: KLONOPIN  Take 1 mg at bedtime by mouth.   co-enzyme Q-10 30 MG capsule Take 30 mg 3 (three) times daily by mouth.   donepezil  10 MG tablet Commonly known as: ARICEPT  Take 5 mg by mouth daily.   memantine  10 MG tablet Commonly known as: NAMENDA  Take 10 mg  by mouth 2 (two) times daily.   rosuvastatin  20 MG tablet Commonly known as: CRESTOR  Take 20 mg by mouth daily.   traMADol  50 MG tablet Commonly known as: ULTRAM  Take 1 tablet (50 mg total) by mouth every 6 (six) hours as needed for moderate pain (pain score 4-6).   venlafaxine XR 150 MG 24 hr capsule Commonly known as: EFFEXOR-XR Take 150 mg by mouth  every morning.        Follow-up Information     Call  Romona Harari, MD.   Specialty: Orthopedic Surgery Why: Follow up from ER visit Contact information: [address] [place] [address] [phone]         Call  Arrie, Ronal Amble, MD.   Specialty: Geriatric Medicine Why: Follow up from ER visit Contact information: [address] [place] [address] [phone]         Go to  [facility] Emergency Department at [facility].   Specialty: Emergency Medicine Why: As needed, If symptoms worsen Contact information: [address] [place]  [address] [phone]               Discharge Exam: Fredricka Weights   01/07/24 1759  Weight: 57.6 kg   HEENT:  /AT, No thrush, no icterus CV:  RRR, no rub, no S3, no S4 Lung:  CTA, no wheeze, no rhonchi Abd:  soft/+BS, NT Ext:  No edema, no lymphangitis, no synovitis, no rash LUE--cap refill < 2 sec;  sensation intact  Condition at discharge: stable  The results of significant diagnostics from this hospitalization (including imaging, microbiology, ancillary and laboratory) are listed below for reference.   Imaging Studies: DG Forearm Left Result Date: 01/07/2024 CLINICAL DATA:  Postreduction EXAM: LEFT FOREARM - 2 VIEW COMPARISON:  01/07/2024 FINDINGS: Improved alignment postreduction of the comminuted fracture of the distal radius, now near anatomic. Displaced ulnar styloid fracture. Soft tissue swelling about the wrist. IMPRESSION: Improved, near anatomic, alignment of the comminuted fracture of the distal radius after reduction. Electronically Signed   By: Norman Gatlin M.D.   On: 01/07/2024 17:56   DG Forearm Left Result Date: 01/07/2024 CLINICAL DATA:  Fall. EXAM: PORTABLE CHEST 1 VIEW, left forearm COMPARISON:  None Available. FINDINGS: Chest: The heart size and mediastinal contours are within normal limits. Minimal atelectasis is noted at the  left lung base. No effusion or pneumothorax is seen. Degenerative changes are present in the thoracic spine and bilateral shoulders. Left forearm: There is a comminuted of the distal radius with posterior displacement of the distal fracture fragments. A displaced ulnar styloid fracture is noted. Soft tissue swelling is present about the wrist and distal forearm. Degenerative changes are noted at the wrist and elbow. IMPRESSION: 1. Mild atelectasis at the left lung base. 2. Comminuted displaced fracture of the distal radius. 3. Displaced ulnar styloid fracture. Electronically Signed   By: Leita Birmingham M.D.   On: 01/07/2024 11:38   DG Chest Port 1 View Result Date: 01/07/2024 CLINICAL DATA:  Fall. EXAM: PORTABLE CHEST 1 VIEW, left forearm COMPARISON:  None Available. FINDINGS: Chest: The heart size and mediastinal contours are within normal limits. Minimal atelectasis is noted at the left lung base. No effusion or pneumothorax is seen. Degenerative changes are present in the thoracic spine and bilateral shoulders. Left forearm: There is a comminuted of the distal radius with posterior displacement of the distal fracture fragments. A displaced ulnar styloid fracture is noted. Soft tissue swelling  is present about the wrist and distal forearm. Degenerative changes are noted at the wrist and elbow. IMPRESSION: 1. Mild atelectasis at the left lung base. 2. Comminuted displaced fracture of the distal radius. 3. Displaced ulnar styloid fracture. Electronically Signed   By: Leita Birmingham M.D.   On: 01/07/2024 11:38    Microbiology: No results found for this or any previous visit.  Labs: CBC: Recent Labs  Lab 01/07/24 1107 01/09/24 0353  WBC 9.0 8.1  NEUTROABS 7.5  --   HGB 12.7 13.3  HCT 37.5 40.6  MCV 90.6 91.6  PLT 203 193   Basic Metabolic Panel: Recent Labs  Lab 01/07/24 1107 01/09/24 0353  NA 136 141  K 4.2 3.5  CL 103 106  CO2 21* 24  GLUCOSE 223* 103*  BUN 24* 15  CREATININE 1.33*  1.14*  CALCIUM  9.6 9.3  MG  --  2.0   Liver Function Tests: No results for input(s): AST, ALT, ALKPHOS, BILITOT, PROT, ALBUMIN in the last 168 hours. CBG: Recent Labs  Lab 01/09/24 0728 01/09/24 1109 01/09/24 1621 01/09/24 2104 01/10/24 0728  GLUCAP 116* 199* 118* 205* 95    Discharge time spent: greater than 30 minutes.  Signed: Alm Schneider, MD [facility] 01/10/2024

## 2024-01-11 DIAGNOSIS — F03C0 Unspecified dementia, severe, without behavioral disturbance, psychotic disturbance, mood disturbance, and anxiety: Secondary | ICD-10-CM | POA: Diagnosis not present

## 2024-01-11 LAB — GLUCOSE, CAPILLARY
Glucose-Capillary: 94 mg/dL (ref 70–99)
Glucose-Capillary: 83 mg/dL (ref 70–99)
Glucose-Capillary: 107 mg/dL — ABNORMAL HIGH (ref 70–99)
Glucose-Capillary: 129 mg/dL — ABNORMAL HIGH (ref 70–99)

## 2024-01-11 MED ORDER — CLONAZEPAM 1 MG PO TABS: 1.0000 mg | ORAL_TABLET | Freq: Every day | ORAL | 0 refills | Status: AC

## 2024-01-11 NOTE — Progress Notes (Signed)
 [facility] PROGRESS NOTE  Blenda Wisecup (DOB: 10/29/1932) FMW:[id number] PCP: Patient, No Pcp Per  Brief Narrative: Kristen Best is an 89 y.o. female with a history of HTN, HLD, T2DM, dementia, stage IIIb CKD, anxiety/depression, and vitamin D deficiency who presented to the ED on 01/07/2024 after a slip and fall at home with left arm pain. X-ray of the left forearm showed a comminuted distal radius fracture with posterior displacement and a displaced ulna styloid fracture.  Her fractures were reduced in the emergency department.  Repeat x-ray showed improved, near anatomic alignment of the comminuted fracture of the distal radius.  Orthopedic surgery,, Dr. Romona was consulted, recommended splinting and outpatient follow up. No surgery was recommended unless she developed uncontrolled pain or neurologic compromise. She was admitted due to unsafe discharge plan to return home, has remained stable while admitted and will discharge to SNF with orthopedic follow up.   Subjective: Hungry, wants breakfast, can't recall if she's met me, denies pain.   Objective: BP (!) 97/52 (BP Location: Right Arm)   Pulse 79   Temp 98.6 F (37 C)   Resp 18   Ht 5' 4 (1.626 m)   Wt 57.6 kg   SpO2 97%   BMI 21.80 kg/m   Gen: No distress, elderly Pulm: Clear, nonlabored  CV: RRR GI: Soft, NT, ND, +BS  Neuro: Alert and disoriented, but interactive/conversant. No new focal deficits. Ext: Left forearm splint in place to proximal hand. Distal swelling noted but warm with intact cap refill and sensation, moves fingers without issue.    Assessment & Plan: Left distal radius and ulna fracture: Following mechanical (non-syncopal) fall at home with [facility]. Reduced with satisfactory alignment post reduction, splinted. Pain controlled. Recommend outpatient orthopedics follow up (case was initially discussed with Dr. Romona) and XR to monitor healing. Remains neurovascularly intact. Has been refusing  tylenol  and not taking any prn's for pain.  - SNF will have bed available 7/17.   Dementia: - Continue aricept , namenda    Stage IIIb CKD: SCr remains near baseline at 1.1. Suggest intermittent monitoring.     HTN: BPs actually soft. Stop home lisinopril .   Depression/anxiety: Quiescent.  - Continue home amitriptyline , venlafaxine, and clonazepam .    HLD:  - Continue rosuvastatin    Diet-controlled T2DM: HbA1c is 6.2%.  Bernardino KATHEE Come, MD [facility] [email or web address] 01/11/2024, 11:14 AM

## 2024-01-11 NOTE — TOC Progression Note (Signed)
 Transition of Care [facility]) - Progression Note    Patient Details  Name: Kristen Best MRN: [id number] Date of Birth: [birth year]  [facility] CM/SW Contact  Lucie Lunger, [place] Phone Number: 01/11/2024, 10:02 AM  Clinical Narrative:    CSW updated that pts insurance auth has been approved at this time for SNF at [facility]. CSW spoke to admissions assistant who states that they have a D/C tmrw and will be able to accept pt at that time. CSW updated RN and MD of this. TOC to follow.   Expected Discharge Plan: Skilled Nursing Facility Barriers to Discharge: Continued Medical Work up  Expected Discharge Plan and Services In-house Referral: Clinical Social Work Discharge Planning Services: CM Consult Post Acute Care Choice: Skilled Nursing Facility Living arrangements for the past 2 months: Single Family Home                                       Social Determinants of Health (SDOH) Interventions SDOH Screenings   Food Insecurity: Patient Unable To Answer (01/09/2024)  Housing: Unknown (01/09/2024)  Transportation Needs: No Transportation Needs (01/09/2024)  Utilities: Patient Unable To Answer (01/09/2024)  Social Connections: Unknown (01/09/2024)  Tobacco Use: Medium Risk (01/07/2024)    Readmission Risk Interventions    01/08/2024   12:55 PM  Readmission Risk Prevention Plan  Post Dischage Appt Complete  Medication Screening Complete  Transportation Screening Complete

## 2024-01-12 DIAGNOSIS — F03C0 Unspecified dementia, severe, without behavioral disturbance, psychotic disturbance, mood disturbance, and anxiety: Secondary | ICD-10-CM | POA: Diagnosis not present

## 2024-01-12 DIAGNOSIS — S62102A Fracture of unspecified carpal bone, left wrist, initial encounter for closed fracture: Secondary | ICD-10-CM | POA: Diagnosis not present

## 2024-01-12 NOTE — Discharge Summary (Signed)
 Physician Discharge Summary   Patient: Kristen Best MRN: [id number] DOB: 03/15/1933  Admit date:     01/07/2024  Discharge date: 01/12/24  Discharge Physician: Bernardino KATHEE Come   PCP: Patient, No Pcp Per   Recommendations at discharge:   Continue routine PCP follow up.  Monitor CBC/BMP. Recommend repeat XR of left arm/wrist and consider orthopedic follow up.   Discharge Diagnoses: Principal Problem:   Dementia (HCC) Active Problems:   Wrist fracture, closed, left, initial encounter   Essential hypertension   Memory loss   Stage 3b chronic kidney disease [facility])   Fall  Hospital Course: Ameila Best is an 89 y.o. female with a history of HTN, HLD, T2DM, dementia, stage IIIb CKD, anxiety/depression, and vitamin D deficiency who presented to the ED on 01/07/2024 after a slip and fall at home with left arm pain. X-ray of the left forearm showed a comminuted distal radius fracture with posterior displacement and a displaced ulna styloid fracture.  Her fractures were reduced in the emergency department.  Repeat x-ray showed improved, near anatomic alignment of the comminuted fracture of the distal radius.  Orthopedic surgery,, Dr. Romona was consulted, recommended splinting and outpatient follow up. No surgery was recommended unless she developed uncontrolled pain or neurologic compromise. She was admitted due to unsafe discharge plan to return home, has remained stable while admitted and will discharge to SNF with orthopedic follow up.   Assessment and Plan: Left distal radius and ulna fracture: Following mechanical (non-syncopal) fall at home with [facility]. Reduced with satisfactory alignment post reduction, splinted. Pain controlled. Recommend outpatient orthopedics follow up (case was initially discussed with Dr. Romona) and XR to monitor healing. Remains neurovascularly intact. Has been refusing tylenol  and not taking any prn's for pain.  - SNF will have bed available 7/17.    Dementia: - Continue aricept , namenda    Stage IIIb CKD: SCr remains near baseline at 1.1. Suggest intermittent monitoring.     HTN: BPs actually soft. Stop home lisinopril .    Depression/anxiety: Quiescent.  - Continue home amitriptyline , venlafaxine, and clonazepam .    HLD:  - Continue rosuvastatin    Diet-controlled T2DM: HbA1c is 6.2%.  Consultants: Orthopedics Procedures performed: None  Disposition: Skilled nursing facility Diet recommendation:  Regular diet DISCHARGE MEDICATION: Allergies as of 01/12/2024   No Known Allergies      Medication List     STOP taking these medications    citalopram 20 MG tablet Commonly known as: CELEXA   fenofibrate  160 MG tablet   lisinopril  10 MG tablet Commonly known as: ZESTRIL        TAKE these medications    acetaminophen  500 MG tablet Commonly known as: TYLENOL  Take 2 tablets (1,000 mg total) by mouth every 8 (eight) hours.   amitriptyline  50 MG tablet Commonly known as: ELAVIL  Take 50 mg by mouth at bedtime.   clonazePAM  1 MG tablet Commonly known as: KLONOPIN  Take 1 tablet (1 mg total) by mouth at bedtime.   co-enzyme Q-10 30 MG capsule Take 30 mg 3 (three) times daily by mouth.   donepezil  10 MG tablet Commonly known as: ARICEPT  Take 5 mg by mouth daily.   memantine  10 MG tablet Commonly known as: NAMENDA  Take 10 mg by mouth 2 (two) times daily.   rosuvastatin  20 MG tablet Commonly known as: CRESTOR  Take 20 mg by mouth daily.   venlafaxine XR 150 MG 24 hr capsule Commonly known as: EFFEXOR-XR Take 150 mg by mouth every morning.  Contact information for follow-up providers     Call  Romona Harari, MD.   Specialty: Orthopedic Surgery Why: Follow up from ER visit Contact information: [address] [place] [address] [phone]         Call  Arrie, Ronal Amble, MD.   Specialty: Geriatric Medicine Why: Follow up from ER visit Contact information: [address] [place] [address] [phone]         Go to  [facility] at [facility].   Specialty: Emergency Medicine Why: As needed, If symptoms worsen Contact information: [address] [place]  [phone]             Contact information for after-discharge care     Destination     [facility] .   Service: Skilled Nursing Contact information: [address] [place]  [address] [phone]                    Discharge Exam: Fredricka Weights   01/07/24 1759  Weight: 57.6 kg  BP 109/67 (BP Location: Right Arm)   Pulse 77   Temp 98.5 F (36.9 C)   Resp 16   Ht 5' 4 (1.626 m)   Wt 57.6 kg   SpO2 96%   BMI 21.80 kg/m   Elderly female in no distress, lively and incompletely oriented but interactive and aware of situation.  LUE with swelling in hand which is stable. Still warm with good cap refill and intact sensation and motor function. Splint in place.   Condition at discharge: stable  The results of significant diagnostics from this hospitalization (including imaging, microbiology, ancillary and laboratory) are listed below for reference.   Imaging Studies: DG Forearm Left Result Date: 01/07/2024 CLINICAL DATA:  Postreduction EXAM: LEFT FOREARM - 2 VIEW COMPARISON:  01/07/2024 FINDINGS: Improved alignment postreduction of the comminuted fracture of the distal radius, now near anatomic. Displaced ulnar styloid fracture. Soft tissue swelling about the wrist. IMPRESSION: Improved, near anatomic, alignment of the comminuted fracture of the distal radius after reduction. Electronically Signed   By: Norman Gatlin M.D.   On: 01/07/2024 17:56   DG Forearm Left Result Date: 01/07/2024 CLINICAL DATA:  Fall. EXAM: PORTABLE CHEST 1 VIEW, left forearm COMPARISON:  None Available. FINDINGS: Chest: The heart size and mediastinal contours are within  normal limits. Minimal atelectasis is noted at the left lung base. No effusion or pneumothorax is seen. Degenerative changes are present in the thoracic spine and bilateral shoulders. Left forearm: There is a comminuted of the distal radius with posterior displacement of the distal fracture fragments. A displaced ulnar styloid fracture is noted. Soft tissue swelling is present about the wrist and distal forearm. Degenerative changes are noted at the wrist and elbow. IMPRESSION: 1. Mild atelectasis at the left lung base. 2. Comminuted displaced fracture of the distal radius. 3. Displaced ulnar styloid fracture. Electronically Signed   By: Leita Birmingham M.D.   On: 01/07/2024 11:38   DG Chest Port 1 View Result Date: 01/07/2024 CLINICAL DATA:  Fall. EXAM: PORTABLE CHEST 1 VIEW, left forearm COMPARISON:  None Available. FINDINGS: Chest: The heart size and mediastinal contours are within normal limits. Minimal atelectasis is noted at the left lung base. No effusion or pneumothorax is seen. Degenerative changes are present in the thoracic spine and bilateral shoulders. Left forearm: There is a comminuted of the distal radius with posterior displacement of  the distal fracture fragments. A displaced ulnar styloid fracture is noted. Soft tissue swelling is present about the wrist and distal forearm. Degenerative changes are noted at the wrist and elbow. IMPRESSION: 1. Mild atelectasis at the left lung base. 2. Comminuted displaced fracture of the distal radius. 3. Displaced ulnar styloid fracture. Electronically Signed   By: Leita Birmingham M.D.   On: 01/07/2024 11:38    Microbiology: No results found for this or any previous visit.  Labs: CBC: Recent Labs  Lab 01/07/24 1107 01/09/24 0353  WBC 9.0 8.1  NEUTROABS 7.5  --   HGB 12.7 13.3  HCT 37.5 40.6  MCV 90.6 91.6  PLT 203 193   Basic Metabolic Panel: Recent Labs  Lab 01/07/24 1107 01/09/24 0353  NA 136 141  K 4.2 3.5  CL 103 106  CO2 21* 24   GLUCOSE 223* 103*  BUN 24* 15  CREATININE 1.33* 1.14*  CALCIUM  9.6 9.3  MG  --  2.0   Liver Function Tests: No results for input(s): AST, ALT, ALKPHOS, BILITOT, PROT, ALBUMIN in the last 168 hours. CBG: Recent Labs  Lab 01/10/24 2117 01/11/24 0749 01/11/24 1120 01/11/24 1608 01/11/24 2011  GLUCAP 133* 94 83 107* 129*    Discharge time spent: greater than 30 minutes.  Signed: Bernardino KATHEE Come, MD [facility] 01/12/2024

## 2024-01-12 NOTE — TOC Transition Note (Signed)
 Transition of Care [facility]) - Discharge Note   Patient Details  Name: Kristen Best MRN: [id number] Date of Birth: 02-04-1933  [facility] CM/SW Contact:  Lucie Lunger, LCSWA Phone Number: 01/12/2024, 11:45 AM  Clinical Narrative:    CSW updated that pt is medically stable for D/C to [facility] today. CSW spoke to admissions and they can admit pt today. CSW updated pts son of plan, he is agreeable. CSW provided RN with room and report number. [facility] called for transport. TOC signing off.   Final next level of care: Skilled Nursing Facility Barriers to Discharge: Barriers Resolved   Patient Goals and CMS Choice Patient states their goals for this hospitalization and ongoing recovery are:: go to SNF CMS Medicare.gov Compare Post Acute Care list provided to:: Patient Represenative (must comment) Choice offered to / list presented to : [facility] [place] ownership interest in [facility].provided to:: Patient    Discharge Placement                Patient to be transferred to facility by: [facility] Name of family member notified: son Patient and family notified of of transfer: 01/12/24  Discharge Plan and Services Additional resources added to the After Visit Summary for   In-house Referral: Clinical Social Work Discharge Planning Services: CM Consult Post Acute Care Choice: Skilled Nursing Facility                               Social Drivers of Health (SDOH) Interventions SDOH Screenings   Food Insecurity: Patient Unable To Answer (01/09/2024)  Housing: Unknown (01/09/2024)  Transportation Needs: No Transportation Needs (01/09/2024)  Utilities: Patient Unable To Answer (01/09/2024)  Social Connections: Unknown (01/09/2024)  Tobacco Use: Medium Risk (01/07/2024)     Readmission Risk Interventions    01/08/2024   12:55 PM  Readmission Risk Prevention Plan  Post Dischage Appt Complete  Medication Screening Complete  Transportation  Screening Complete

## 2024-01-12 NOTE — Care Management Important Message (Signed)
 Important Message  Patient Details  Name: Kristen Best MRN: [id number] Date of Birth: 12-Jul-1932   Important Message Given:  Yes - Medicare IM     [name] Andruw Battie 01/12/2024, 10:31 AM

## 2024-04-28 DEATH — deceased
